# Patient Record
Sex: Male | Born: 1960
Health system: Southern US, Community
[De-identification: ages and names within clinical notes are randomized; demographics above are authoritative.]

## PROBLEM LIST (undated history)

## (undated) DIAGNOSIS — E119 Type 2 diabetes mellitus without complications: Secondary | ICD-10-CM

## (undated) DIAGNOSIS — J45909 Unspecified asthma, uncomplicated: Secondary | ICD-10-CM

## (undated) DIAGNOSIS — T7840XA Allergy, unspecified, initial encounter: Secondary | ICD-10-CM

## (undated) HISTORY — DX: Allergy, unspecified, initial encounter: T78.40XA

## (undated) HISTORY — DX: Unspecified asthma, uncomplicated: J45.909

## (undated) HISTORY — DX: Type 2 diabetes mellitus without complications: E11.9

---

## 2003-04-25 ENCOUNTER — Emergency Department (HOSPITAL_COMMUNITY): Admission: EM | Admit: 2003-04-25 | Discharge: 2003-04-25 | Payer: Self-pay | Admitting: Emergency Medicine

## 2007-06-11 ENCOUNTER — Emergency Department (HOSPITAL_COMMUNITY): Admission: EM | Admit: 2007-06-11 | Discharge: 2007-06-11 | Payer: Self-pay | Admitting: Emergency Medicine

## 2009-03-25 ENCOUNTER — Ambulatory Visit: Payer: Self-pay | Admitting: Internal Medicine

## 2009-03-28 LAB — CBC WITH DIFFERENTIAL/PLATELET
Basophils Absolute: 0 10*3/uL (ref 0.0–0.1)
Eosinophils Absolute: 0.1 10*3/uL (ref 0.0–0.5)
HGB: 8.8 g/dL — ABNORMAL LOW (ref 13.0–17.1)
MCV: 95.1 fL (ref 79.3–98.0)
MONO#: 0.3 10*3/uL (ref 0.1–0.9)
NEUT#: 1.4 10*3/uL — ABNORMAL LOW (ref 1.5–6.5)
RBC: 2.83 10*6/uL — ABNORMAL LOW (ref 4.20–5.82)
RDW: 22.8 % — ABNORMAL HIGH (ref 11.0–14.6)
WBC: 3.9 10*3/uL — ABNORMAL LOW (ref 4.0–10.3)
lymph#: 2 10*3/uL (ref 0.9–3.3)

## 2009-03-28 LAB — COMPREHENSIVE METABOLIC PANEL
ALT: 31 U/L (ref 0–53)
Albumin: 4.2 g/dL (ref 3.5–5.2)
CO2: 22 mEq/L (ref 19–32)
Chloride: 107 mEq/L (ref 96–112)
Glucose, Bld: 90 mg/dL (ref 70–99)
Potassium: 3.9 mEq/L (ref 3.5–5.3)
Sodium: 141 mEq/L (ref 135–145)
Total Protein: 6.7 g/dL (ref 6.0–8.3)

## 2009-03-28 LAB — RETICULOCYTES
RBC: 2.84 10*6/uL — ABNORMAL LOW (ref 4.20–5.82)
Retic %: 7.51 % — ABNORMAL HIGH (ref 0.50–1.60)

## 2009-03-28 LAB — LACTATE DEHYDROGENASE: LDH: 315 U/L — ABNORMAL HIGH (ref 94–250)

## 2009-03-31 LAB — DIRECT ANTIGLOBULIN TEST (NOT AT ARMC): DAT IgG: NEGATIVE

## 2009-03-31 LAB — PROTEIN ELECTROPHORESIS, SERUM
Albumin ELP: 61.1 % (ref 55.8–66.1)
Alpha-1-Globulin: 4.3 % (ref 2.9–4.9)
Alpha-2-Globulin: 9 % (ref 7.1–11.8)
Gamma Globulin: 14.8 % (ref 11.1–18.8)

## 2009-03-31 LAB — HEMOGLOBINOPATHY EVALUATION
Hemoglobin Other: 0 % (ref 0.0–0.0)
Hgb A: 97.5 % (ref 96.8–97.8)
Hgb S Quant: 0 % (ref 0.0–0.0)

## 2009-03-31 LAB — IRON AND TIBC
%SAT: 10 % — ABNORMAL LOW (ref 20–55)
Iron: 27 ug/dL — ABNORMAL LOW (ref 42–165)
UIBC: 234 ug/dL

## 2009-03-31 LAB — FOLATE: Folate: 11.4 ng/mL

## 2009-03-31 LAB — FERRITIN: Ferritin: 280 ng/mL (ref 22–322)

## 2009-03-31 LAB — VITAMIN B12: Vitamin B-12: 245 pg/mL (ref 211–911)

## 2009-04-18 LAB — CBC WITH DIFFERENTIAL/PLATELET
Basophils Absolute: 0.1 10*3/uL (ref 0.0–0.1)
Eosinophils Absolute: 0.2 10*3/uL (ref 0.0–0.5)
HGB: 12.6 g/dL — ABNORMAL LOW (ref 13.0–17.1)
MCV: 92.2 fL (ref 79.3–98.0)
MONO%: 7.7 % (ref 0.0–14.0)
NEUT#: 2.1 10*3/uL (ref 1.5–6.5)
Platelets: 385 10*3/uL (ref 140–400)
RDW: 19.3 % — ABNORMAL HIGH (ref 11.0–14.6)

## 2010-01-13 ENCOUNTER — Emergency Department (HOSPITAL_COMMUNITY): Admission: EM | Admit: 2010-01-13 | Discharge: 2010-01-13 | Payer: Self-pay | Admitting: Emergency Medicine

## 2010-09-01 ENCOUNTER — Emergency Department (HOSPITAL_COMMUNITY)
Admission: EM | Admit: 2010-09-01 | Discharge: 2010-09-01 | Disposition: A | Discharge status: Home / Self Care | Payer: Self-pay | Attending: Emergency Medicine | Admitting: Emergency Medicine

## 2010-09-01 DIAGNOSIS — R1032 Left lower quadrant pain: Secondary | ICD-10-CM | POA: Insufficient documentation

## 2010-09-01 DIAGNOSIS — R350 Frequency of micturition: Secondary | ICD-10-CM | POA: Insufficient documentation

## 2010-09-01 LAB — URINALYSIS, ROUTINE W REFLEX MICROSCOPIC
Glucose, UA: NEGATIVE mg/dL
Leukocytes, UA: NEGATIVE
pH: 6 (ref 5.0–8.0)

## 2012-08-11 ENCOUNTER — Emergency Department (HOSPITAL_COMMUNITY)
Admission: EM | Admit: 2012-08-11 | Discharge: 2012-08-11 | Disposition: A | Discharge status: Home / Self Care | Payer: Self-pay | Attending: Emergency Medicine | Admitting: Emergency Medicine

## 2012-08-11 ENCOUNTER — Emergency Department (HOSPITAL_COMMUNITY): Payer: Self-pay

## 2012-08-11 ENCOUNTER — Encounter (HOSPITAL_COMMUNITY): Payer: Self-pay | Admitting: Cardiology

## 2012-08-11 DIAGNOSIS — Y9389 Activity, other specified: Secondary | ICD-10-CM | POA: Insufficient documentation

## 2012-08-11 DIAGNOSIS — S4980XA Other specified injuries of shoulder and upper arm, unspecified arm, initial encounter: Secondary | ICD-10-CM | POA: Insufficient documentation

## 2012-08-11 DIAGNOSIS — R52 Pain, unspecified: Secondary | ICD-10-CM | POA: Insufficient documentation

## 2012-08-11 DIAGNOSIS — Y9289 Other specified places as the place of occurrence of the external cause: Secondary | ICD-10-CM | POA: Insufficient documentation

## 2012-08-11 DIAGNOSIS — M25512 Pain in left shoulder: Secondary | ICD-10-CM

## 2012-08-11 DIAGNOSIS — X500XXA Overexertion from strenuous movement or load, initial encounter: Secondary | ICD-10-CM | POA: Insufficient documentation

## 2012-08-11 DIAGNOSIS — S46909A Unspecified injury of unspecified muscle, fascia and tendon at shoulder and upper arm level, unspecified arm, initial encounter: Secondary | ICD-10-CM | POA: Insufficient documentation

## 2012-08-11 MED ORDER — TRAMADOL HCL 50 MG PO TABS: 50.0000 mg | ORAL_TABLET | Freq: Four times a day (QID) | ORAL | Status: DC | PRN
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
## 2012-08-14 NOTE — ED Provider Notes (Signed)
Medical screening examination/treatment/procedure(s) were performed by non-physician practitioner and as supervising physician I was immediately available for consultation/collaboration.   Lion Fernandez, MD 08/14/12 0712 

## 2013-03-24 ENCOUNTER — Emergency Department (HOSPITAL_COMMUNITY)
Admission: EM | Admit: 2013-03-24 | Discharge: 2013-03-24 | Disposition: A | Discharge status: Home / Self Care | Payer: PRIVATE HEALTH INSURANCE | Attending: Emergency Medicine | Admitting: Emergency Medicine

## 2013-03-24 ENCOUNTER — Encounter (HOSPITAL_COMMUNITY): Payer: Self-pay | Admitting: Emergency Medicine

## 2013-03-24 DIAGNOSIS — H579 Unspecified disorder of eye and adnexa: Secondary | ICD-10-CM | POA: Insufficient documentation

## 2013-03-24 DIAGNOSIS — J069 Acute upper respiratory infection, unspecified: Secondary | ICD-10-CM | POA: Insufficient documentation

## 2013-03-24 DIAGNOSIS — H9209 Otalgia, unspecified ear: Secondary | ICD-10-CM | POA: Insufficient documentation

## 2013-03-24 DIAGNOSIS — L299 Pruritus, unspecified: Secondary | ICD-10-CM | POA: Insufficient documentation

## 2013-03-24 DIAGNOSIS — Z79899 Other long term (current) drug therapy: Secondary | ICD-10-CM | POA: Insufficient documentation

## 2013-03-24 MED ORDER — DIPHENHYDRAMINE HCL 25 MG PO TABS: 25.0000 mg | ORAL_TABLET | Freq: Four times a day (QID) | ORAL | Status: DC | PRN

## 2013-03-24 MED ORDER — ACETAMINOPHEN 500 MG PO TABS: 500.0000 mg | ORAL_TABLET | Freq: Four times a day (QID) | ORAL | Status: DC | PRN

## 2013-03-24 NOTE — ED Provider Notes (Signed)
CSN: [id number]     Arrival date & time 03/24/13  1048 History   This chart was scribed for non-physician practitioner Emilia Beck, PA-C, working with Shon Baton, MD, by Yevette Edwards, ED Scribe. This patient was seen in room TR09C/TR09C and the patient's care was started at 12:32 PM. First MD Initiated Contact with Patient 03/24/13 1124     Chief Complaint  Patient presents with  . Nasal Congestion   The history is provided by the patient. No language interpreter was used.   HPI Comments: Ethan Holt is a 53 y.o. male who presents to the Emergency Department complaining of nasal congestion which has been occurring for five days, but which worsened yesterday evening.  As associated symptoms, the pt has also experienced a sore throat, eye itching, and otalgia. The pt has used OTC sinus/allergy medication with temporary relief. Ethan Holt is a non-smoker.   The pt is a Psychologist, occupational by trade.   History reviewed. No pertinent past medical history. History reviewed. No pertinent past surgical history. History reviewed. No pertinent family history. History  Substance Use Topics  . Smoking status: Never Smoker   . Smokeless tobacco: Not on file  . Alcohol Use: No    Review of Systems  Constitutional: Negative for fever.  HENT: Positive for congestion, ear pain, postnasal drip and sore throat.   Eyes: Positive for itching.  All other systems reviewed and are negative.   Allergies  Review of patient's allergies indicates no known allergies.  Home Medications   Current Outpatient Rx  Name  Route  Sig  Dispense  Refill  . Cyanocobalamin (VITAMIN B-12 PO)   Oral   Take 1 tablet by mouth daily.         . Multiple Vitamin (MULTIVITAMIN WITH MINERALS) TABS   Oral   Take 1 tablet by mouth daily.         Marland Kitchen OVER THE COUNTER MEDICATION   Oral   Take 1 tablet by mouth daily. Medication: Liver Right         . traMADol (ULTRAM) 50 MG tablet   Oral   Take 1 tablet (50 mg  total) by mouth every 6 (six) hours as needed for pain.   15 tablet   0    Triage Vitals: BP 132/92  Pulse 79  Temp(Src) 97.8 F (36.6 C) (Oral)  Resp 18  SpO2 94%  Physical Exam  Nursing note and vitals reviewed. Constitutional: He is oriented to person, place, and time. He appears well-developed and well-nourished. No distress.  HENT:  Head: Normocephalic and atraumatic.  Mild erythema to oropharynx. No exudate. No sinus tenderness to palpation.   Eyes: EOM are normal.  Neck: Neck supple. No tracheal deviation present.  Cardiovascular: Normal rate.   Pulmonary/Chest: Effort normal and breath sounds normal. No respiratory distress. He has no wheezes.  Musculoskeletal: Normal range of motion.  Neurological: He is alert and oriented to person, place, and time.  Skin: Skin is warm and dry.  Psychiatric: He has a normal mood and affect. His behavior is normal.    ED Course  Procedures (including critical care time)  DIAGNOSTIC STUDIES: Oxygen Saturation is 94% on room air, adequate by my interpretation.    COORDINATION OF CARE:  12:36 PM- Discussed treatment plan with patient, which includes medication for pain and congestion, and the patient agreed to the plan. Reminded the pt to use Tylenol as needed and to drink plenty of fluids.   Labs Review  Labs  Reviewed - No data to display Imaging Review No results found.  EKG Interpretation   None       MDM   1. URI (upper respiratory infection)     12:42 PM Patient likely has URI. Patient will have tylenol and benadryl for symptoms. Vitals stable and patient afebrile. Patient advised to return with worsening or concerning symptoms.   I personally performed the services described in this documentation, which was scribed in my presence. The recorded information has been reviewed and is accurate.    Emilia BeckKaitlyn Kaylana Fenstermacher, PA-C 03/24/13 1243

## 2013-03-24 NOTE — ED Provider Notes (Signed)
Medical screening examination/treatment/procedure(s) were performed by non-physician practitioner and as supervising physician I was immediately available for consultation/collaboration.  EKG Interpretation   None        Shon Batonourtney F Shenicka Sunderlin, MD 03/24/13 1347

## 2013-03-24 NOTE — ED Notes (Signed)
Pt c/o nasal congestion, drainage, sore throat, ear pain, and eyes aching x5 days

## 2013-03-24 NOTE — Discharge Instructions (Signed)
Take tylenol as needed for pain and fever. Take benadryl as needed for congestion. Refer to attached documents for more information.

## 2013-11-10 ENCOUNTER — Emergency Department (HOSPITAL_COMMUNITY)
Admission: EM | Admit: 2013-11-10 | Discharge: 2013-11-10 | Disposition: A | Discharge status: Home / Self Care | Payer: Managed Care, Other (non HMO) | Attending: Emergency Medicine | Admitting: Emergency Medicine

## 2013-11-10 ENCOUNTER — Encounter (HOSPITAL_COMMUNITY): Payer: Self-pay | Admitting: Emergency Medicine

## 2013-11-10 DIAGNOSIS — Z791 Long term (current) use of non-steroidal anti-inflammatories (NSAID): Secondary | ICD-10-CM | POA: Insufficient documentation

## 2013-11-10 DIAGNOSIS — H16139 Photokeratitis, unspecified eye: Secondary | ICD-10-CM | POA: Diagnosis not present

## 2013-11-10 DIAGNOSIS — Z79899 Other long term (current) drug therapy: Secondary | ICD-10-CM | POA: Insufficient documentation

## 2013-11-10 DIAGNOSIS — H5710 Ocular pain, unspecified eye: Secondary | ICD-10-CM | POA: Diagnosis present

## 2013-11-10 DIAGNOSIS — H16133 Photokeratitis, bilateral: Secondary | ICD-10-CM

## 2013-11-10 MED ORDER — FLUORESCEIN SODIUM 1 MG OP STRP
1.0000 | ORAL_STRIP | Freq: Once | OPHTHALMIC | Status: DC
  Filled 2013-11-10: qty 1

## 2013-11-10 MED ORDER — FLUORESCEIN-BENOXINATE 0.25-0.4 % OP SOLN
1.0000 [drp] | Freq: Once | OPHTHALMIC | Status: DC
  Filled 2013-11-10: qty 5

## 2013-11-10 MED ORDER — IBUPROFEN 800 MG PO TABS: 800.0000 mg | ORAL_TABLET | Freq: Three times a day (TID) | ORAL | Status: DC

## 2013-11-10 MED ORDER — TETRACAINE HCL 0.5 % OP SOLN
2.0000 [drp] | Freq: Once | OPHTHALMIC | Status: DC
  Filled 2013-11-10: qty 2
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
## 2014-04-21 ENCOUNTER — Encounter (HOSPITAL_COMMUNITY): Payer: Self-pay | Admitting: *Deleted

## 2014-04-21 ENCOUNTER — Emergency Department (HOSPITAL_COMMUNITY)
Admission: EM | Admit: 2014-04-21 | Discharge: 2014-04-22 | Disposition: A | Discharge status: Home / Self Care | Payer: Managed Care, Other (non HMO) | Attending: Emergency Medicine | Admitting: Emergency Medicine

## 2014-04-21 ENCOUNTER — Emergency Department (HOSPITAL_COMMUNITY): Payer: Managed Care, Other (non HMO)

## 2014-04-21 DIAGNOSIS — S199XXA Unspecified injury of neck, initial encounter: Secondary | ICD-10-CM | POA: Diagnosis present

## 2014-04-21 DIAGNOSIS — Y9389 Activity, other specified: Secondary | ICD-10-CM | POA: Diagnosis not present

## 2014-04-21 DIAGNOSIS — Z79899 Other long term (current) drug therapy: Secondary | ICD-10-CM | POA: Diagnosis not present

## 2014-04-21 DIAGNOSIS — Y92410 Unspecified street and highway as the place of occurrence of the external cause: Secondary | ICD-10-CM | POA: Insufficient documentation

## 2014-04-21 DIAGNOSIS — S161XXA Strain of muscle, fascia and tendon at neck level, initial encounter: Secondary | ICD-10-CM | POA: Diagnosis not present

## 2014-04-21 DIAGNOSIS — Z791 Long term (current) use of non-steroidal anti-inflammatories (NSAID): Secondary | ICD-10-CM | POA: Insufficient documentation

## 2014-04-21 DIAGNOSIS — Y998 Other external cause status: Secondary | ICD-10-CM | POA: Insufficient documentation

## 2014-04-21 DIAGNOSIS — S3992XA Unspecified injury of lower back, initial encounter: Secondary | ICD-10-CM | POA: Insufficient documentation
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
## 2014-04-22 MED ORDER — HYDROCODONE-ACETAMINOPHEN 5-325 MG PO TABS: 2.0000 | ORAL_TABLET | ORAL | Status: DC | PRN

## 2014-04-22 NOTE — Discharge Instructions (Signed)

## 2014-04-22 NOTE — ED Notes (Signed)
Pt made aware to return if symptoms worsen or if any life threatening symptoms occur.   

## 2015-07-04 ENCOUNTER — Emergency Department (HOSPITAL_COMMUNITY)
Admission: EM | Admit: 2015-07-04 | Discharge: 2015-07-04 | Disposition: A | Discharge status: Home / Self Care | Payer: Managed Care, Other (non HMO) | Attending: Emergency Medicine | Admitting: Emergency Medicine

## 2015-07-04 ENCOUNTER — Encounter (HOSPITAL_COMMUNITY): Payer: Self-pay | Admitting: *Deleted

## 2015-07-04 DIAGNOSIS — J069 Acute upper respiratory infection, unspecified: Secondary | ICD-10-CM | POA: Diagnosis not present

## 2015-07-04 DIAGNOSIS — Z791 Long term (current) use of non-steroidal anti-inflammatories (NSAID): Secondary | ICD-10-CM | POA: Insufficient documentation

## 2015-07-04 DIAGNOSIS — Z79899 Other long term (current) drug therapy: Secondary | ICD-10-CM | POA: Diagnosis not present

## 2015-07-04 DIAGNOSIS — J029 Acute pharyngitis, unspecified: Secondary | ICD-10-CM | POA: Diagnosis present

## 2015-07-04 MED ORDER — PROMETHAZINE-DM 6.25-15 MG/5ML PO SYRP: 5.0000 mL | ORAL_SOLUTION | Freq: Four times a day (QID) | ORAL | Status: DC | PRN

## 2015-07-04 MED ORDER — GUAIFENESIN 100 MG/5ML PO LIQD: 100.0000 mg | ORAL | Status: DC | PRN

## 2015-07-04 NOTE — Discharge Instructions (Signed)
Upper Respiratory Infection, Adult Most upper respiratory infections (URIs) are a viral infection of the air passages leading to the lungs. A URI affects the nose, throat, and upper air passages. The most common type of URI is nasopharyngitis and is typically referred to as "the common cold." URIs run their course and usually go away on their own. Most of the time, a URI does not require medical attention, but sometimes a bacterial infection in the upper airways can follow a viral infection. This is called a secondary infection. Sinus and middle ear infections are common types of secondary upper respiratory infections. Bacterial pneumonia can also complicate a URI. A URI can worsen asthma and chronic obstructive pulmonary disease (COPD). Sometimes, these complications can require emergency medical care and may be life threatening.  CAUSES Almost all URIs are caused by viruses. A virus is a type of germ and can spread from one person to another.  RISKS FACTORS You may be at risk for a URI if:   You smoke.   You have chronic heart or lung disease.  You have a weakened defense (immune) system.   You are very young or very old.   You have nasal allergies or asthma.  You work in crowded or poorly ventilated areas.  You work in health care facilities or schools. SIGNS AND SYMPTOMS  Symptoms typically develop 2-3 days after you come in contact with a cold virus. Most viral URIs last 7-10 days. However, viral URIs from the influenza virus (flu virus) can last 14-18 days and are typically more severe. Symptoms may include:   Runny or stuffy (congested) nose.   Sneezing.   Cough.   Sore throat.   Headache.   Fatigue.   Fever.   Loss of appetite.   Pain in your forehead, behind your eyes, and over your cheekbones (sinus pain).  Muscle aches.  DIAGNOSIS  Your health care provider may diagnose a URI by:  Physical exam.  Tests to check that your symptoms are not due to  another condition such as:  Strep throat.  Sinusitis.  Pneumonia.  Asthma. TREATMENT  A URI goes away on its own with time. It cannot be cured with medicines, but medicines may be prescribed or recommended to relieve symptoms. Medicines may help:  Reduce your fever.  Reduce your cough.  Relieve nasal congestion. HOME CARE INSTRUCTIONS   Take medicines only as directed by your health care provider.   Gargle warm saltwater or take cough drops to comfort your throat as directed by your health care provider.  Use a warm mist humidifier or inhale steam from a shower to increase air moisture. This may make it easier to breathe.  Drink enough fluid to keep your urine clear or pale yellow.   Eat soups and other clear broths and maintain good nutrition.   Rest as needed.   Return to work when your temperature has returned to normal or as your health care provider advises. You may need to stay home longer to avoid infecting others. You can also use a face mask and careful hand washing to prevent spread of the virus.  Increase the usage of your inhaler if you have asthma.   Do not use any tobacco products, including cigarettes, chewing tobacco, or electronic cigarettes. If you need help quitting, ask your health care provider. PREVENTION  The best way to protect yourself from getting a cold is to practice good hygiene.   Avoid oral or hand contact with people with cold   symptoms.   Wash your hands often if contact occurs.  There is no clear evidence that vitamin C, vitamin E, echinacea, or exercise reduces the chance of developing a cold. However, it is always recommended to get plenty of rest, exercise, and practice good nutrition.  SEEK MEDICAL CARE IF:   You are getting worse rather than better.   Your symptoms are not controlled by medicine.   You have chills.  You have worsening shortness of breath.  You have brown or red mucus.  You have yellow or brown nasal  discharge.  You have pain in your face, especially when you bend forward.  You have a fever.  You have swollen neck glands.  You have pain while swallowing.  You have white areas in the back of your throat. SEEK IMMEDIATE MEDICAL CARE IF:   You have severe or persistent:  Headache.  Ear pain.  Sinus pain.  Chest pain.  You have chronic lung disease and any of the following:  Wheezing.  Prolonged cough.  Coughing up blood.  A change in your usual mucus.  You have a stiff neck.  You have changes in your:  Vision.  Hearing.  Thinking.  Mood. MAKE SURE YOU:   Understand these instructions.  Will watch your condition.  Will get help right away if you are not doing well or get worse.   This information is not intended to replace advice given to you by your health care provider. Make sure you discuss any questions you have with your health care provider.   Document Released: 08/08/2000 Document Revised: 06/29/2014 Document Reviewed: 05/20/2013 Elsevier Interactive Patient Education 2016 [employer]  

## 2015-07-04 NOTE — ED Notes (Signed)
Declined W/C at D/C and was escorted to lobby by RN. 

## 2015-07-04 NOTE — ED Provider Notes (Signed)
CSN: [id number]     Arrival date & time 07/04/15  4098 History  By signing my name below, I, Sonum Patel, attest that this documentation has been prepared under the direction and in the presence of Fayrene Helper, PA-C. Electronically Signed: Sonum Patel, Neurosurgeon. 07/04/2015. 10:27 AM.    Chief Complaint  Patient presents with  . URI    The history is provided by the patient. No language interpreter was used.     HPI Comments: Ethan Holt is a 55 y.o. male who presents to the Emergency Department complaining of 4 days of constant sinus pressure with associated post-nasal drainage, bilateral eye pain, ear discomfort, and sneezing. He reports a sore throat 2 days ago which was alleviated with throat spray and gargling. He has been using OTC with mild relief. He reports sick contacts with similar symptoms. He denies cough, rash, SOB, CP, nausea, vomiting, diarrhea, fever, chills, generalized myalgias.   History reviewed. No pertinent past medical history. History reviewed. No pertinent past surgical history. History reviewed. No pertinent family history. Social History  Substance Use Topics  . Smoking status: Never Smoker   . Smokeless tobacco: None  . Alcohol Use: No    Review of Systems  Constitutional: Negative for fever and chills.  HENT: Positive for ear pain, postnasal drip, sinus pressure, sneezing and sore throat.   Eyes: Positive for pain.  Respiratory: Negative for cough and shortness of breath.   Cardiovascular: Negative for chest pain.  Gastrointestinal: Negative for nausea, vomiting, abdominal pain and diarrhea.  Skin: Negative for rash.      Allergies  Review of patient's allergies indicates no known allergies.  Home Medications   Prior to Admission medications   Medication Sig Start Date End Date Taking? Authorizing Provider  acetaminophen (TYLENOL) 500 MG tablet Take 1 tablet (500 mg total) by mouth every 6 (six) hours as needed. 03/24/13   Kaitlyn Szekalski, PA-C   Cyanocobalamin (VITAMIN B-12 PO) Take 2 tablets by mouth daily.     Historical Provider, MD  diphenhydrAMINE (BENADRYL) 25 MG tablet Take 1 tablet (25 mg total) by mouth every 6 (six) hours as needed for itching (Rash). 03/24/13   Emilia Beck, PA-C  HYDROcodone-acetaminophen (NORCO/VICODIN) 5-325 MG per tablet Take 2 tablets by mouth every 4 (four) hours as needed. 04/22/14   Elson Areas, PA-C  ibuprofen (ADVIL,MOTRIN) 800 MG tablet Take 1 tablet (800 mg total) by mouth 3 (three) times daily. 11/10/13   Tomasita Crumble, MD  Multiple Vitamin (MULTIVITAMIN WITH MINERALS) TABS Take 1 tablet by mouth daily.    Historical Provider, MD  naproxen sodium (ANAPROX) 220 MG tablet Take 440 mg by mouth 2 (two) times daily with a meal.    Historical Provider, MD  OVER THE COUNTER MEDICATION Take 1 tablet by mouth daily. Medication: Liver Right    Historical Provider, MD   BP 122/76 mmHg  Pulse 75  Temp(Src) 98.1 F (36.7 C) (Oral)  Resp 18  SpO2 99% Physical Exam  Constitutional: He is oriented to person, place, and time. He appears well-developed and well-nourished.  HENT:  Head: Normocephalic and atraumatic.  Nose: Right sinus exhibits maxillary sinus tenderness. Left sinus exhibits maxillary sinus tenderness.  Mouth/Throat: Uvula is midline, oropharynx is clear and moist and mucous membranes are normal. No uvula swelling. No oropharyngeal exudate.  Throat and uvula normal, no exudates, normal phonation. No lymphadenopathy. Mild maxillary or ethmoidal tenderness.   Neck: Normal range of motion.  Neck with full ROM and no nuchal  rigidity   Cardiovascular: Normal rate.   Pulmonary/Chest: Effort normal.  Lymphadenopathy:    He has no cervical adenopathy.  Neurological: He is alert and oriented to person, place, and time.  Skin: Skin is warm and dry.  Psychiatric: He has a normal mood and affect.  Nursing note and vitals reviewed.   ED Course  Procedures (including critical care  time)  DIAGNOSTIC STUDIES: Oxygen Saturation is 99% on RA, normal by my interpretation.    COORDINATION OF CARE: 10:29 AM Discussed symptomatic treatment with patient and will discharge home with guaifenesin and Promethazine-DM. Discussed treatment plan with pt at bedside and pt agreed to plan.     MDM   Final diagnoses:  URI (upper respiratory infection)    BP 122/76 mmHg  Pulse 75  Temp(Src) 98.1 F (36.7 C) (Oral)  Resp 18  SpO2 99%  I personally performed the services described in this documentation, which was scribed in my presence. The recorded information has been reviewed and is accurate.     Fayrene HelperBowie Tyreon Frigon, PA-C 07/04/15 1039  Alvira MondayErin Schlossman, MD 07/04/15 [phone]

## 2015-07-04 NOTE — ED Notes (Signed)
Pt reports sinus pressure and drainage since Friday. No acute distress noted at triage.

## 2015-08-19 ENCOUNTER — Emergency Department (HOSPITAL_BASED_OUTPATIENT_CLINIC_OR_DEPARTMENT_OTHER)
Admission: EM | Admit: 2015-08-19 | Discharge: 2015-08-19 | Disposition: A | Discharge status: Home / Self Care | Payer: Managed Care, Other (non HMO) | Attending: Emergency Medicine | Admitting: Emergency Medicine

## 2015-08-19 ENCOUNTER — Encounter (HOSPITAL_BASED_OUTPATIENT_CLINIC_OR_DEPARTMENT_OTHER): Payer: Self-pay | Admitting: Emergency Medicine

## 2015-08-19 DIAGNOSIS — M25511 Pain in right shoulder: Secondary | ICD-10-CM | POA: Insufficient documentation

## 2015-08-19 NOTE — ED Notes (Signed)
Patient states that he woke up yesterday morning with right shoulder pain. Patient states that he feels like he slept wrong on his shoulder. The patient reports that he woke up this am with the same pain. The patient reports that it hurts more with movement

## 2015-08-19 NOTE — ED Provider Notes (Signed)
CSN: [id number]     Arrival date & time 08/19/15  [id number] History   First MD Initiated Contact with Patient 08/19/15 1002     Chief Complaint  Patient presents with  . Shoulder Pain    HPI   55 year old male presents today with right shoulder pain. Patient reports that he woke up 2 days ago with pain in the anterior shoulder. Patient reports symptoms slightly improved, woke up again this morning with right anterior shoulder pain. Patient reports the pain is sharp in nature, worse with overhead movements. He denies any loss of distal sensation strength or motor function. He denies any redness, warmth to touch, history of trauma. No history of the same. No medications prior to arrival  History reviewed. No pertinent past medical history. History reviewed. No pertinent past surgical history. History reviewed. No pertinent family history. Social History  Substance Use Topics  . Smoking status: Never Smoker   . Smokeless tobacco: None  . Alcohol Use: No    Review of Systems  All other systems reviewed and are negative.   Allergies  Review of patient's allergies indicates no known allergies.  Home Medications   Prior to Admission medications   Medication Sig Start Date End Date Taking? Authorizing Provider  acetaminophen (TYLENOL) 500 MG tablet Take 1 tablet (500 mg total) by mouth every 6 (six) hours as needed. 03/24/13   Kaitlyn Szekalski, PA-C  Cyanocobalamin (VITAMIN B-12 PO) Take 2 tablets by mouth daily.     Historical Provider, MD  diphenhydrAMINE (BENADRYL) 25 MG tablet Take 1 tablet (25 mg total) by mouth every 6 (six) hours as needed for itching (Rash). 03/24/13   Kaitlyn Szekalski, PA-C  guaiFENesin (ROBITUSSIN) 100 MG/5ML liquid Take 5-10 mLs (100-200 mg total) by mouth every 4 (four) hours as needed for congestion. 07/04/15   Fayrene HelperBowie Tran, PA-C  HYDROcodone-acetaminophen (NORCO/VICODIN) 5-325 MG per tablet Take 2 tablets by mouth every 4 (four) hours as needed. 04/22/14   Elson AreasLeslie K  Sofia, PA-C  ibuprofen (ADVIL,MOTRIN) 800 MG tablet Take 1 tablet (800 mg total) by mouth 3 (three) times daily. 11/10/13   Tomasita CrumbleAdeleke Oni, MD  Multiple Vitamin (MULTIVITAMIN WITH MINERALS) TABS Take 1 tablet by mouth daily.    Historical Provider, MD  naproxen sodium (ANAPROX) 220 MG tablet Take 440 mg by mouth 2 (two) times daily with a meal.    Historical Provider, MD  OVER THE COUNTER MEDICATION Take 1 tablet by mouth daily. Medication: Liver Right    Historical Provider, MD  promethazine-dextromethorphan (PROMETHAZINE-DM) 6.25-15 MG/5ML syrup Take 5 mLs by mouth 4 (four) times daily as needed for cough. 07/04/15   Fayrene HelperBowie Tran, PA-C   BP 128/92 mmHg  Pulse 65  Temp(Src) 97.6 F (36.4 C) (Oral)  Resp 18  Wt 94.348 kg  SpO2 100% Physical Exam  Constitutional: He is oriented to person, place, and time. He appears well-developed and well-nourished.  HENT:  Head: Normocephalic and atraumatic.  Eyes: Conjunctivae are normal. Pupils are equal, round, and reactive to light. Right eye exhibits no discharge. Left eye exhibits no discharge. No scleral icterus.  Neck: Normal range of motion. No JVD present. No tracheal deviation present.  Pulmonary/Chest: Effort normal. No stridor.  Musculoskeletal:  Minor tenderness to palpation of the right anterior shoulder, full active range of motion. Pain with forward flexion, external rotation. Grip strength 5 out of 5, sensation intact, cap refill less than 3 seconds. No warmth to touch, swelling, signs of infectious etiology, no signs of trauma  Neurological: He is alert and oriented to person, place, and time. Coordination normal.  Psychiatric: He has a normal mood and affect. His behavior is normal. Judgment and thought content normal.  Nursing note and vitals reviewed.   ED Course  Procedures (including critical care time) Labs Review Labs Reviewed - No data to display  Imaging Review No results found. I have personally reviewed and evaluated these  images and lab results as part of my medical decision-making.   EKG Interpretation None      MDM   Final diagnoses:  Right shoulder pain    Labs:  Imaging:  Consults:  Therapeutics:  Discharge Meds:   Assessment/Plan:54 from male presents today with right shoulder pain. This is likely musculoskeletal in nature. Patient has no signs of infectious etiology, no concerning signs or symptoms that would necessitate acute imaging here. Patient will be referred to orthopedist for further evaluation if symptoms persist beyond 2 weeks, or if they worsen. Patient instructed to use ice, ibuprofen, no heavy lifting, shoulder exercises. Return precautions given, verbalized understanding and agreement today's plan        Eyvonne MechanicJeffrey Wyman Meschke, PA-C 08/19/15 1021  Rolan BuccoMelanie Belfi, MD 08/19/15 1040

## 2015-08-19 NOTE — Discharge Instructions (Signed)
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
## 2017-07-06 DIAGNOSIS — J0100 Acute maxillary sinusitis, unspecified: Secondary | ICD-10-CM | POA: Diagnosis not present

## 2018-01-27 DIAGNOSIS — J302 Other seasonal allergic rhinitis: Secondary | ICD-10-CM | POA: Diagnosis not present

## 2018-01-27 DIAGNOSIS — T7849XA Other allergy, initial encounter: Secondary | ICD-10-CM | POA: Diagnosis not present

## 2018-02-23 ENCOUNTER — Encounter (HOSPITAL_BASED_OUTPATIENT_CLINIC_OR_DEPARTMENT_OTHER): Payer: Self-pay | Admitting: Emergency Medicine

## 2018-02-23 ENCOUNTER — Other Ambulatory Visit: Payer: Self-pay

## 2018-02-23 ENCOUNTER — Emergency Department (HOSPITAL_BASED_OUTPATIENT_CLINIC_OR_DEPARTMENT_OTHER)
Admission: EM | Admit: 2018-02-23 | Discharge: 2018-02-23 | Disposition: A | Discharge status: Home / Self Care | Payer: BLUE CROSS/BLUE SHIELD | Attending: Emergency Medicine | Admitting: Emergency Medicine

## 2018-02-23 DIAGNOSIS — E119 Type 2 diabetes mellitus without complications: Secondary | ICD-10-CM

## 2018-02-23 DIAGNOSIS — R739 Hyperglycemia, unspecified: Secondary | ICD-10-CM

## 2018-02-23 DIAGNOSIS — Z79899 Other long term (current) drug therapy: Secondary | ICD-10-CM | POA: Diagnosis not present

## 2018-02-23 DIAGNOSIS — Z7984 Long term (current) use of oral hypoglycemic drugs: Secondary | ICD-10-CM | POA: Insufficient documentation

## 2018-02-23 DIAGNOSIS — N179 Acute kidney failure, unspecified: Secondary | ICD-10-CM | POA: Diagnosis not present

## 2018-02-23 DIAGNOSIS — R5383 Other fatigue: Secondary | ICD-10-CM | POA: Diagnosis not present

## 2018-02-23 DIAGNOSIS — E1165 Type 2 diabetes mellitus with hyperglycemia: Secondary | ICD-10-CM | POA: Insufficient documentation

## 2018-02-23 LAB — BASIC METABOLIC PANEL
Anion gap: 14 (ref 5–15)
BUN: 20 mg/dL (ref 6–20)
CHLORIDE: 94 mmol/L — AB (ref 98–111)
CO2: 22 mmol/L (ref 22–32)
Calcium: 10.5 mg/dL — ABNORMAL HIGH (ref 8.9–10.3)
Creatinine, Ser: 1.39 mg/dL — ABNORMAL HIGH (ref 0.61–1.24)
GFR calc non Af Amer: 55 mL/min — ABNORMAL LOW (ref >60)
GLUCOSE: 625 mg/dL — AB (ref 70–99)
POTASSIUM: 4.5 mmol/L (ref 3.5–5.1)
Sodium: 130 mmol/L — ABNORMAL LOW (ref 135–145)

## 2018-02-23 LAB — URINALYSIS, ROUTINE W REFLEX MICROSCOPIC
Bilirubin Urine: NEGATIVE
Hgb urine dipstick: NEGATIVE
Ketones, ur: 40 mg/dL — AB
Leukocytes, UA: NEGATIVE
Nitrite: NEGATIVE
PROTEIN: NEGATIVE mg/dL
pH: 5.5 (ref 5.0–8.0)

## 2018-02-23 LAB — CBC
HCT: 46.1 % (ref 39.0–52.0)
HEMOGLOBIN: 14.6 g/dL (ref 13.0–17.0)
MCH: 23.2 pg — ABNORMAL LOW (ref 26.0–34.0)
MCHC: 31.7 g/dL (ref 30.0–36.0)
MCV: 73.3 fL — AB (ref 80.0–100.0)
NRBC: 0 % (ref 0.0–0.2)
PLATELETS: 361 10*3/uL (ref 150–400)
RBC: 6.29 MIL/uL — AB (ref 4.22–5.81)
RDW: 13.8 % (ref 11.5–15.5)
WBC: 7.1 10*3/uL (ref 4.0–10.5)

## 2018-02-23 LAB — CBG MONITORING, ED
GLUCOSE-CAPILLARY: 510 mg/dL — AB (ref 70–99)
Glucose-Capillary: >600 mg/dL (ref 70–99)
Glucose-Capillary: 448 mg/dL — ABNORMAL HIGH (ref 70–99)

## 2018-02-23 LAB — URINALYSIS, MICROSCOPIC (REFLEX)
RBC / HPF: NONE SEEN RBC/hpf (ref 0–5)
SQUAMOUS EPITHELIAL / LPF: NONE SEEN (ref 0–5)

## 2018-02-23 MED ORDER — SODIUM CHLORIDE 0.9 % IV BOLUS
2000.0000 mL | Freq: Once | INTRAVENOUS | Status: AC
  Administered 2018-02-23: 2000 mL via INTRAVENOUS

## 2018-02-23 MED ORDER — METFORMIN HCL 1000 MG PO TABS: 1000.0000 mg | ORAL_TABLET | Freq: Two times a day (BID) | ORAL | 0 refills | Status: DC

## 2018-02-23 MED ORDER — METFORMIN HCL 500 MG PO TABS
500.0000 mg | ORAL_TABLET | Freq: Once | ORAL | Status: AC
  Administered 2018-02-23: 500 mg via ORAL
  Filled 2018-02-23: qty 1

## 2018-02-23 NOTE — Discharge Instructions (Addendum)
All the 3 clinics listed below tomorrow to set up an appointment.  Tell them that you were recently found to be diabetic, and need to establish care and management. Take metformin twice a day. Nature staying well-hydrated water. Avoid sugars and carbohydrates.  There is information about foods and eating plans for diabetes. Return to the emergency room if you develop any new, worsening, concerning symptoms.  Return to the ER or to an urgent care in 1 week for blood sugar recheck if you are unable to establish care with a primary care doctor.

## 2018-02-23 NOTE — ED Notes (Signed)
Pt reports taking supplements every day. No other medication. No other hx. Pt reports frequent thirst and fatigue.

## 2018-02-23 NOTE — ED Notes (Addendum)
Blood sugar is 625. Sophia PA is aware

## 2018-02-23 NOTE — ED Triage Notes (Signed)
Pt states he has increase fatigue, thirsty and urination for the past 2 weeks denies any diabetes hx.

## 2018-02-23 NOTE — ED Provider Notes (Signed)
[facility] EMERGENCY DEPARTMENT Provider Note   CSN: [id number] Arrival date & time: 02/23/18  1856     History   Chief Complaint Chief Complaint  Patient presents with  . Fatigue    HPI Ethan Holt is a 57 y.o. male presenting for evaluation of polyuria, polydipsia, and tiredness.  Patient states about 3 weeks ago, he started to develop polydipsia.  2 weeks ago, he developed polyuria, and for the past several days, he has had much more tiredness and fatigue.  He states he feels sluggish.  He denies history of similar.  He denies fevers, chills, nasal congestion, sore throat, cough, chest pain, shortness breath, nausea, vomiting, abdominal pain, or normal bowel movements.  He denies dysuria or hematuria.  Patient states he has no medical problems, takes no medications daily.  He denies a history of diabetes.  Pt states he does not have a PCP, but is working on it  HPI  History reviewed. No pertinent past medical history.  There are no active problems to display for this patient.   History reviewed. No pertinent surgical history.      Home Medications    Prior to Admission medications   Medication Sig Start Date End Date Taking? Authorizing Provider  acetaminophen (TYLENOL) 500 MG tablet Take 1 tablet (500 mg total) by mouth every 6 (six) hours as needed. 03/24/13   Szekalski, Yvonna Alanis, PA-C  Cyanocobalamin (VITAMIN B-12 PO) Take 2 tablets by mouth daily.     [provider]  diphenhydrAMINE (BENADRYL) 25 MG tablet Take 1 tablet (25 mg total) by mouth every 6 (six) hours as needed for itching (Rash). 03/24/13   Emilia Beck, PA-C  guaiFENesin (ROBITUSSIN) 100 MG/5ML liquid Take 5-10 mLs (100-200 mg total) by mouth every 4 (four) hours as needed for congestion. 07/04/15   Fayrene Helper, PA-C  HYDROcodone-acetaminophen (NORCO/VICODIN) 5-325 MG per tablet Take 2 tablets by mouth every 4 (four) hours as needed. 04/22/14   Elson Areas, PA-C  ibuprofen  (ADVIL,MOTRIN) 800 MG tablet Take 1 tablet (800 mg total) by mouth 3 (three) times daily. 11/10/13   Tomasita Crumble, MD  metFORMIN (GLUCOPHAGE) 1000 MG tablet Take 1 tablet (1,000 mg total) by mouth 2 (two) times daily for 14 days. 02/23/18 03/09/18  Euva Rundell, PA-C  Multiple Vitamin (MULTIVITAMIN WITH MINERALS) TABS Take 1 tablet by mouth daily.    [provider]  naproxen sodium (ANAPROX) 220 MG tablet Take 440 mg by mouth 2 (two) times daily with a meal.    [provider]  OVER THE COUNTER MEDICATION Take 1 tablet by mouth daily. Medication: Liver Right    [provider]  promethazine-dextromethorphan (PROMETHAZINE-DM) 6.25-15 MG/5ML syrup Take 5 mLs by mouth 4 (four) times daily as needed for cough. 07/04/15   Fayrene Helper, PA-C    Family History No family history on file.  Social History Social History   Tobacco Use  . Smoking status: Never Smoker  Substance Use Topics  . Alcohol use: No  . Drug use: No     Allergies   Patient has no known allergies.   Review of Systems Review of Systems  Constitutional: Positive for fatigue.  Endocrine: Positive for polydipsia and polyuria.  All other systems reviewed and are negative.    Physical Exam Updated Vital Signs BP (!) 148/92 (BP Location: Right Arm)   Pulse 73   Temp 97.8 F (36.6 C) (Oral)   Resp 18   Ht 5\' 9"  (1.753 m)  Wt 84.4 kg   SpO2 100%   BMI 27.47 kg/m   Physical Exam Vitals signs and nursing note reviewed.  Constitutional:      General: He is not in acute distress.    Appearance: He is well-developed.     Comments: Sitting in the bed in no acute distress  HENT:     Head: Normocephalic and atraumatic.     Mouth/Throat:     Mouth: Mucous membranes are dry.     Comments: MM dry.  OP clear without tonsillar swelling or exudate.  Uvula midline with ago palate rise.  TMs nonerythematous nonbulging bilaterally. Eyes:     Conjunctiva/sclera: Conjunctivae normal.     Pupils:  Pupils are equal, round, and reactive to light.  Neck:     Musculoskeletal: Normal range of motion and neck supple.  Cardiovascular:     Rate and Rhythm: Normal rate and regular rhythm.     Comments: Heart rate normal upon my evaluation Pulmonary:     Effort: Pulmonary effort is normal. No respiratory distress.     Breath sounds: Normal breath sounds. No wheezing.  Abdominal:     General: There is no distension.     Palpations: Abdomen is soft. There is no mass.     Tenderness: There is no abdominal tenderness. There is no guarding or rebound.  Musculoskeletal: Normal range of motion.  Skin:    General: Skin is warm and dry.     Capillary Refill: Capillary refill takes less than 2 seconds.  Neurological:     Mental Status: He is alert and oriented to person, place, and time.     Comments: Alert and oriented.      ED Treatments / Results  Labs (all labs ordered are listed, but only abnormal results are displayed) Labs Reviewed  BASIC METABOLIC PANEL - Abnormal; Notable for the following components:      Result Value   Sodium 130 (*)    Chloride 94 (*)    Glucose, Bld 625 (*)    Creatinine, Ser 1.39 (*)    Calcium 10.5 (*)    GFR calc non Af Amer 55 (*)    All other components within normal limits  CBC - Abnormal; Notable for the following components:   RBC 6.29 (*)    MCV 73.3 (*)    MCH 23.2 (*)    All other components within normal limits  URINALYSIS, ROUTINE W REFLEX MICROSCOPIC - Abnormal; Notable for the following components:   Color, Urine STRAW (*)    Specific Gravity, Urine <1.005 (*)    Glucose, UA >=500 (*)    Ketones, ur 40 (*)    All other components within normal limits  URINALYSIS, MICROSCOPIC (REFLEX) - Abnormal; Notable for the following components:   Bacteria, UA RARE (*)    All other components within normal limits  CBG MONITORING, ED - Abnormal; Notable for the following components:   Glucose-Capillary >600 (*)    All other components within  normal limits  CBG MONITORING, ED - Abnormal; Notable for the following components:   Glucose-Capillary 510 (*)    All other components within normal limits  CBG MONITORING, ED - Abnormal; Notable for the following components:   Glucose-Capillary 448 (*)    All other components within normal limits    EKG None  Radiology No results found.  Procedures Procedures (including critical care time)  Medications Ordered in ED Medications  sodium chloride 0.9 % bolus 2,000 mL (0 mLs Intravenous  Stopped 02/23/18 2220)  metFORMIN (GLUCOPHAGE) tablet 500 mg (500 mg Oral Given 02/23/18 2251)     Initial Impression / Assessment and Plan / ED Course  I have reviewed the triage vital signs and the nursing notes.  Pertinent labs & imaging results that were available during my care of the patient were reviewed by me and considered in my medical decision making (see chart for details).     Patient presenting for evaluation of polyuria, polydipsia, and fatigue.  Physical exam shows patient who appears dehydrated, but in no acute distress.  Blood sugar critically elevated at 625.  Patient without a history of diabetes.  Discussed findings with patient.  Will obtain blood work to rule out DKA or concerning kidney injury.  2 L fluid bolus given, patient encouraged to drink water.  Labs show mild AKI at 1.39, GFR stable.  Otherwise, reassuring, no sign of DKA.  Urine without infection, does show glucose.  On recheck, BGL gradually improving, went down 200.  Reassessment of the patient, he states he is feeling much better.  He has no further sluggishness, and does not feel as thirsty.  I discussed findings with patient, informed him that this is indicative of diabetes.  Discussed with him that his kidney function was slightly worse today, and he will need to be followed up closely with primary care for further management.  Patient offered admission due to new diabetes and no established primary care, but as  he is feeling better he would like to go home.  Patient instructed to return immediately if anything worsens.  Patient to return in 1 week for recheck if he is unable to establish with a primary care.  Will consult with case management to assist in making an appointment.  Discussed at length importance of diet.  Will start patient on metformin twice daily.  At this time, patient appears safe for discharge.  Return precautions given.  Patient states he understands and agrees plan.   Final Clinical Impressions(s) / ED Diagnoses   Final diagnoses:  Type 2 diabetes mellitus without complication, without long-term current use of insulin (HCC)  Hyperglycemia  AKI (acute kidney injury) [facility](HCC)    ED Discharge Orders         Ordered    metFORMIN (GLUCOPHAGE) 1000 MG tablet  2 times daily     02/23/18 2239           Alveria ApleyCaccavale, Kollins Fenter, PA-C 02/23/18 2331    Melene PlanFloyd, Dan, DO 02/24/18 0016

## 2018-02-23 NOTE — ED Notes (Signed)
ED Provider at bedside. 

## 2018-02-24 NOTE — Care Management Note (Signed)
Case Management Note  CM consulted for no pcp with need for follow up.  CM contacted pt via phone and advised him to call [employer] to get a list of in-network doctors.  He is aware he may not get an appointment very quickly to establish care as a new pt.  [facility] possible [facility] care appointment due to the fact that pt was only given 14 days work of metformin as a new DM.  CM is not even sure that pt will get an appointment with TOC before he runs out of medication.  Updated [place], PA via messages.  No further CM needs noted at this time.  Santia Labate, Lynnae SandhoffAngela N, RN 02/24/2018, 9:20 AM

## 2018-02-25 ENCOUNTER — Telehealth: Payer: Self-pay

## 2018-02-25 NOTE — Telephone Encounter (Signed)
Message received from Vernice JeffersonAnglea Kritzer, RN CM requesting a hospital [facility].  Informed her that the patient has an appointment scheduled to establish care at [facility] 02/27/2018.

## 2018-02-27 ENCOUNTER — Telehealth: Payer: Self-pay | Admitting: Medical

## 2018-02-27 ENCOUNTER — Encounter: Payer: Self-pay | Admitting: Medical

## 2018-02-27 ENCOUNTER — Ambulatory Visit (INDEPENDENT_AMBULATORY_CARE_PROVIDER_SITE_OTHER): Payer: BLUE CROSS/BLUE SHIELD | Admitting: Medical

## 2018-02-27 VITALS — BP 124/73 | HR 86 | Temp 97.7°F | Resp 16 | Ht 69.0 in | Wt 191.9 lb

## 2018-02-27 DIAGNOSIS — Z0100 Encounter for examination of eyes and vision without abnormal findings: Secondary | ICD-10-CM

## 2018-02-27 DIAGNOSIS — R5383 Other fatigue: Secondary | ICD-10-CM | POA: Diagnosis not present

## 2018-02-27 DIAGNOSIS — E119 Type 2 diabetes mellitus without complications: Secondary | ICD-10-CM

## 2018-02-27 DIAGNOSIS — R0683 Snoring: Secondary | ICD-10-CM

## 2018-02-27 LAB — CBC WITH DIFFERENTIAL/PLATELET
Basophils Absolute: 0 10*3/uL (ref 0.0–0.1)
Basophils Relative: 0.8 % (ref 0.0–3.0)
Eosinophils Absolute: 0.1 10*3/uL (ref 0.0–0.7)
Eosinophils Relative: 1.6 % (ref 0.0–5.0)
HCT: 44 % (ref 39.0–52.0)
Hemoglobin: 14 g/dL (ref 13.0–17.0)
LYMPHS ABS: 1.9 10*3/uL (ref 0.7–4.0)
Lymphocytes Relative: 40.1 % (ref 12.0–46.0)
MCHC: 31.8 g/dL (ref 30.0–36.0)
MCV: 74.1 fl — ABNORMAL LOW (ref 78.0–100.0)
MONO ABS: 0.4 10*3/uL (ref 0.1–1.0)
Monocytes Relative: 9 % (ref 3.0–12.0)
Neutro Abs: 2.3 10*3/uL (ref 1.4–7.7)
Neutrophils Relative %: 48.5 % (ref 43.0–77.0)
Platelets: 298 10*3/uL (ref 150.0–400.0)
RBC: 5.95 Mil/uL — ABNORMAL HIGH (ref 4.22–5.81)
RDW: 14.3 % (ref 11.5–15.5)
WBC: 4.8 10*3/uL (ref 4.0–10.5)

## 2018-02-27 LAB — COMPREHENSIVE METABOLIC PANEL
ALT: 25 U/L (ref 0–53)
AST: 18 U/L (ref 0–37)
Albumin: 4.1 g/dL (ref 3.5–5.2)
Alkaline Phosphatase: 68 U/L (ref 39–117)
BUN: 24 mg/dL — AB (ref 6–23)
CHLORIDE: 100 meq/L (ref 96–112)
CO2: 18 meq/L — AB (ref 19–32)
CREATININE: 1.19 mg/dL (ref 0.40–1.50)
Calcium: 9.7 mg/dL (ref 8.4–10.5)
GFR: 80.46 mL/min (ref >60.00)
Glucose, Bld: 148 mg/dL — ABNORMAL HIGH (ref 70–99)
Potassium: 5.3 mEq/L — ABNORMAL HIGH (ref 3.5–5.1)
SODIUM: 133 meq/L — AB (ref 135–145)
Total Bilirubin: 0.8 mg/dL (ref 0.2–1.2)
Total Protein: 6.7 g/dL (ref 6.0–8.3)

## 2018-02-27 LAB — HEMOGLOBIN A1C: Hgb A1c MFr Bld: 14.9 % — ABNORMAL HIGH (ref 4.6–6.5)

## 2018-02-27 LAB — VITAMIN B12

## 2018-02-27 LAB — LIPID PANEL
CHOL/HDL RATIO: 5
Cholesterol: 208 mg/dL — ABNORMAL HIGH (ref 0–200)
HDL: 38.9 mg/dL — ABNORMAL LOW (ref >39.00)
LDL CALC: 146 mg/dL — AB (ref 0–99)
NonHDL: 169.37
Triglycerides: 116 mg/dL (ref 0.0–149.0)
VLDL: 23.2 mg/dL (ref 0.0–40.0)

## 2018-02-27 LAB — VITAMIN D 25 HYDROXY (VIT D DEFICIENCY, FRACTURES): VITD: 20.74 ng/mL — ABNORMAL LOW (ref 30.00–100.00)

## 2018-02-27 LAB — TSH: TSH: 1.57 u[IU]/mL (ref 0.35–4.50)

## 2018-02-27 MED ORDER — METFORMIN HCL 1000 MG PO TABS: 1000.0000 mg | ORAL_TABLET | Freq: Two times a day (BID) | ORAL | 0 refills | Status: DC

## 2018-02-27 MED ORDER — VITAMIN D (ERGOCALCIFEROL) 1.25 MG (50000 UNIT) PO CAPS: 50000.0000 [IU] | ORAL_CAPSULE | ORAL | 0 refills | Status: DC

## 2018-02-27 MED ORDER — EMPAGLIFLOZIN 10 MG PO TABS: 10.0000 mg | ORAL_TABLET | Freq: Every day | ORAL | 3 refills | Status: DC

## 2018-02-27 MED ORDER — METFORMIN HCL 1000 MG PO TABS: 1000.0000 mg | ORAL_TABLET | Freq: Two times a day (BID) | ORAL | 5 refills | Status: DC

## 2018-02-27 MED ORDER — ATORVASTATIN CALCIUM 10 MG PO TABS: 10.0000 mg | ORAL_TABLET | Freq: Every day | ORAL | 3 refills | Status: DC

## 2018-02-27 NOTE — Patient Instructions (Signed)
Nice to meet you today.  For your new diagnosis of diabetes, I want you to continue with metformin and I prescribed Jardiance as add-on.  I want you to call your insurance and see if they will send you a glucometer or get the preferred name of the glucometer that they cover.  Give me the name of the glucometer and I could send in prescription of a diabetic supplies.  I did go ahead and place referral for diabetic education.  Start eating a low sugar diet and getting some daily exercise.  We will get metabolic panel today and A1c.  Your reported fatigue might be associated with diabetes.  But will go ahead and add CBC, B12, B1, TSH and vitamin D to your lab work today.  Also added lipid panel to blood work to assess you have high cholesterol.  Even if your cholesterol is not very high it would likely be best for you to start a statin low dose.  For snoring, I placed referral to pulmonologist.  Also did place referral to optometrist for diabetic eye exam.  Referral staff should be calling you to get the name of the optometrist that you have used in the past.  Follow-up in 3 to 4 weeks or as needed.

## 2018-02-27 NOTE — Telephone Encounter (Signed)
Rx atorvastatin and vit D to pharmacy.

## 2018-02-27 NOTE — Progress Notes (Signed)
Subjective:    Patient ID: Ethan Holt, male    DOB: 1960/04/06, 58 y.o.   MRN: [id number]  HPI  First time in with me. He works for [employer]. He drives a box truck. Pt states he does work out  once a week now. Pt was drinking soda but stopped since dx of diabetes.   Pt in for follow up from ED.(those notes were reviewed today)   Pt states just diagnosed this Sunday. Pt was not getting physicals over the years.  Pt was started on metformin 1000 mg 1 tab po bid.(no side effects)  He was having polyuria, polydipsia and severe fatigue before ED evaluation.  He feels some better than he did in ED. Feels some fatigued daily now.   Wife is a Engineer, civil (consulting)nurse. Pt sugars stared to come down since Tuesday. Was 300 and now in high 190. Pt wife is a Engineer, civil (consulting)nurse. She had jardiance and gave him tab. She states this seemed to help a lot.     Review of Systems  Constitutional: Positive for fatigue. Negative for chills and fever.  HENT: Negative for congestion and drooling.        Pt wife states he snores and it apperas he falls asleep easily.  Respiratory: Negative for cough, chest tightness, shortness of breath and wheezing.   Cardiovascular: Negative for chest pain and palpitations.  Gastrointestinal: Negative for abdominal pain.  Endocrine: Negative for polydipsia, polyphagia and polyuria.  Genitourinary: Negative for dysuria, flank pain, frequency, penile pain and testicular pain.  Musculoskeletal: Negative for back pain, neck pain and neck stiffness.  Skin: Negative for rash.  Neurological: Negative for dizziness, speech difficulty, weakness and numbness.  Hematological: Negative for adenopathy. Does not bruise/bleed easily.  Psychiatric/Behavioral: Negative for behavioral problems, confusion, sleep disturbance and suicidal ideas.    Past Medical History:  Diagnosis Date  . Diabetes mellitus without complication [facility](HCC)      Social History   Socioeconomic History  . Marital status: Married   Spouse name: Not on file  . Number of children: Not on file  . Years of education: Not on file  . Highest education level: Not on file  Occupational History  . Not on file  Social Needs  . Financial resource strain: Not on file  . Food insecurity:    Worry: Not on file    Inability: Not on file  . Transportation needs:    Medical: Not on file    Non-medical: Not on file  Tobacco Use  . Smoking status: Never Smoker  . Smokeless tobacco: Current User  Substance and Sexual Activity  . Alcohol use: No  . Drug use: No  . Sexual activity: Not on file  Lifestyle  . Physical activity:    Days per week: Not on file    Minutes per session: Not on file  . Stress: Not on file  Relationships  . Social connections:    Talks on phone: Not on file    Gets together: Not on file    Attends religious service: Not on file    Active member of club or organization: Not on file    Attends meetings of clubs or organizations: Not on file    Relationship status: Not on file  . Intimate partner violence:    Fear of current or ex partner: Not on file    Emotionally abused: Not on file    Physically abused: Not on file    Forced sexual activity: Not on file  Other Topics Concern  . Not on file  Social History Narrative  . Not on file    No past surgical history on file.  No family history on file.  No Known Allergies  Current Outpatient Medications on File Prior to Visit  Medication Sig Dispense Refill  . Cyanocobalamin (VITAMIN B-12 PO) Take 2 tablets by mouth daily.     Marland Kitchen ibuprofen (ADVIL,MOTRIN) 800 MG tablet Take 1 tablet (800 mg total) by mouth 3 (three) times daily. 21 tablet 0  . metFORMIN (GLUCOPHAGE) 1000 MG tablet Take 1 tablet (1,000 mg total) by mouth 2 (two) times daily for 14 days. 28 tablet 0  . Multiple Vitamin (MULTIVITAMIN WITH MINERALS) TABS Take 1 tablet by mouth daily.    . naproxen sodium (ANAPROX) 220 MG tablet Take 440 mg by mouth 2 (two) times daily with a meal.      No current facility-administered medications on file prior to visit.     BP 124/73   Pulse 86   Temp 97.7 F (36.5 C) (Oral)   Resp 16   Ht 5\' 9"  (1.753 m)   Wt 191 lb 14.4 oz (87 kg)   SpO2 100%   BMI 28.34 kg/m       Objective:   Physical Exam  General Mental Status- Alert. General Appearance- Not in acute distress.   Skin General: Color- Normal Color. Moisture- Normal Moisture.  Neck Carotid Arteries- Normal color. Moisture- Normal Moisture. No carotid bruits. No JVD.  Chest and Lung Exam Auscultation: Breath Sounds:-Normal.  Cardiovascular Auscultation:Rythm- Regular. Murmurs & Other Heart Sounds:Auscultation of the heart reveals- No Murmurs.  Abdomen Inspection:-Inspeection Normal. Palpation/Percussion:Note:No mass. Palpation and Percussion of the abdomen reveal- Non Tender, Non Distended + BS, no rebound or guarding.   Neurologic Cranial Nerve exam:- CN III-XII intact(No nystagmus), symmetric smile. Strength:- 5/5 equal and symmetric strength both upper and lower extremities.   Lower ext- see quality metrics      Assessment & Plan:  Nice to meet you today.  For your new diagnosis of diabetes, I want you to continue with metformin and I prescribed Jardiance as add-on.  I want you to call your insurance and see if they will send you a glucometer or get the preferred name of the glucometer that they cover.  Give me the name of the glucometer and I could send in prescription of a diabetic supplies.  I did go ahead and place referral for diabetic education.  Start eating a low sugar diet and getting some daily exercise.  We will get metabolic panel today and A1c.  Your reported fatigue might be associated with diabetes.  But will go ahead and add CBC, B12, B1, TSH and vitamin D to your lab work today.  Also added lipid panel to blood work to assess you have high cholesterol.  Even if your cholesterol is not very high it would likely be best for you to  start a statin low dose.  For snoring, I placed referral to pulmonologist.  Also did place referral to optometrist for diabetic eye exam.  Referral staff should be calling you to get the name of the optometrist that you have used in the past.  Follow-up in 3 to 4 weeks or as needed.  45 minutes was spent with patient.  It took longer than standard as this was a new patient visit as well as her emergency department follow-up appointment for uncontrolled diabetes.  [name], PA-C

## 2018-03-01 LAB — VITAMIN B1

## 2018-03-07 ENCOUNTER — Telehealth: Payer: Self-pay | Admitting: Medical

## 2018-03-07 NOTE — Telephone Encounter (Signed)
Tried to reach pt no answer and vm is not set up Okay for PC to give information.

## 2018-03-07 NOTE — Telephone Encounter (Signed)
I had asked to follow up in one month but if sugars are still high have him make appointment for earlier appointment. May need other med/treatment regimen. Can he give some numbers as to high what are his high numbers and what are lows?

## 2018-03-07 NOTE — Telephone Encounter (Signed)
Copied from CRM [id number]. Topic: General - Other >> Mar 07, 2018 10:38 AM Tamela Oddi wrote: Reason for CRM: Patient called to request that the nurse or doctor give him a call regarding his A1C levels.  On his last blood test, patient said it was high.  Patient stated that he has reduced his sugar intake and has questions on why it was still high.  Please call patient to discuss.  CB# [phone]

## 2018-03-20 ENCOUNTER — Encounter: Payer: Self-pay | Admitting: Medical

## 2018-03-20 ENCOUNTER — Ambulatory Visit (INDEPENDENT_AMBULATORY_CARE_PROVIDER_SITE_OTHER): Payer: BLUE CROSS/BLUE SHIELD | Admitting: Medical

## 2018-03-20 VITALS — BP 120/77 | HR 69 | Temp 97.6°F | Resp 16 | Ht 69.0 in | Wt 192.6 lb

## 2018-03-20 DIAGNOSIS — E119 Type 2 diabetes mellitus without complications: Secondary | ICD-10-CM

## 2018-03-20 DIAGNOSIS — E785 Hyperlipidemia, unspecified: Secondary | ICD-10-CM | POA: Diagnosis not present

## 2018-03-20 MED ORDER — METFORMIN HCL 1000 MG PO TABS: 1000.0000 mg | ORAL_TABLET | Freq: Two times a day (BID) | ORAL | 5 refills | Status: DC

## 2018-03-20 MED ORDER — EMPAGLIFLOZIN 10 MG PO TABS: 10.0000 mg | ORAL_TABLET | Freq: Every day | ORAL | 3 refills | Status: DC

## 2018-03-20 NOTE — Progress Notes (Signed)
Subjective:    Patient ID: Ethan Holt, male    DOB: 04-May-1960, 58 y.o.   MRN: [id number]  HPI  Pt in for follow up.  His sugars recently in low 100's. His readings are in low 100 and sometimes in 80.  Pt is eating a lot better. He is exercising after work. Walking even after work.  Pt informed to get diabetic eye exam. He sees optometrist yearly.   Pt declines flu vaccine.  Declined tdap today. He states will get on next visit.    Review of Systems  Constitutional: Negative for chills, fatigue and fever.  Respiratory: Negative for cough, chest tightness, shortness of breath and wheezing.   Cardiovascular: Negative for chest pain and palpitations.  Gastrointestinal: Negative for abdominal pain, constipation, diarrhea and nausea.  Genitourinary: Negative for decreased urine volume, difficulty urinating, flank pain, frequency, testicular pain and urgency.  Musculoskeletal: Negative for back pain, gait problem and neck pain.  Skin: Negative for rash.  Neurological: Negative for dizziness, weakness, numbness and headaches.  Hematological: Negative for adenopathy. Does not bruise/bleed easily.  Psychiatric/Behavioral: Negative for behavioral problems, confusion, dysphoric mood and self-injury.    Past Medical History:  Diagnosis Date  . Allergy    spring  . Asthma    as child   . Diabetes mellitus without complication [facility])      Social History   Socioeconomic History  . Marital status: Married    Spouse name: Not on file  . Number of children: Not on file  . Years of education: Not on file  . Highest education level: Not on file  Occupational History  . Not on file  Social Needs  . Financial resource strain: Not on file  . Food insecurity:    Worry: Not on file    Inability: Not on file  . Transportation needs:    Medical: Not on file    Non-medical: Not on file  Tobacco Use  . Smoking status: Never Smoker  . Smokeless tobacco: Current User  Substance and  Sexual Activity  . Alcohol use: No  . Drug use: No  . Sexual activity: Yes  Lifestyle  . Physical activity:    Days per week: Not on file    Minutes per session: Not on file  . Stress: Not on file  Relationships  . Social connections:    Talks on phone: Not on file    Gets together: Not on file    Attends religious service: Not on file    Active member of club or organization: Not on file    Attends meetings of clubs or organizations: Not on file    Relationship status: Not on file  . Intimate partner violence:    Fear of current or ex partner: Not on file    Emotionally abused: Not on file    Physically abused: Not on file    Forced sexual activity: Not on file  Other Topics Concern  . Not on file  Social History Narrative  . Not on file    No past surgical history on file.  No family history on file.  No Known Allergies  Current Outpatient Medications on File Prior to Visit  Medication Sig Dispense Refill  . atorvastatin (LIPITOR) 10 MG tablet Take 1 tablet (10 mg total) by mouth daily. 30 tablet 3  . Cyanocobalamin (VITAMIN B-12 PO) Take 2 tablets by mouth daily.     . empagliflozin (JARDIANCE) 10 MG TABS tablet Take 10 mg  by mouth daily. 30 tablet 3  . ibuprofen (ADVIL,MOTRIN) 800 MG tablet Take 1 tablet (800 mg total) by mouth 3 (three) times daily. 21 tablet 0  . Multiple Vitamin (MULTIVITAMIN WITH MINERALS) TABS Take 1 tablet by mouth daily.    . naproxen sodium (ANAPROX) 220 MG tablet Take 440 mg by mouth 2 (two) times daily with a meal.    . Vitamin D, Ergocalciferol, (DRISDOL) 1.25 MG (50000 UT) CAPS capsule Take 1 capsule (50,000 Units total) by mouth every 7 (seven) days. 8 capsule 0  . metFORMIN (GLUCOPHAGE) 1000 MG tablet Take 1 tablet (1,000 mg total) by mouth 2 (two) times daily for 14 days. 60 tablet 5  . metFORMIN (GLUCOPHAGE) 1000 MG tablet Take 1 tablet (1,000 mg total) by mouth 2 (two) times daily for 14 days. 28 tablet 0   No current  facility-administered medications on file prior to visit.     BP 120/77   Pulse 69   Temp 97.6 F (36.4 C) (Oral)   Resp 16   Ht 5\' 9"  (1.753 m)   Wt 192 lb 9.6 oz (87.4 kg)   SpO2 100%   BMI 28.44 kg/m       Objective:   Physical Exam  General Mental Status- Alert. General Appearance- Not in acute distress.   Skin General: Color- Normal Color. Moisture- Normal Moisture.  Neck Carotid Arteries- Normal color. Moisture- Normal Moisture. No carotid bruits. No JVD.  Chest and Lung Exam Auscultation: Breath Sounds:-Normal.  Cardiovascular Auscultation:Rythm- Regular. Murmurs & Other Heart Sounds:Auscultation of the heart reveals- No Murmurs.  Abdomen Inspection:-Inspeection Normal. Palpation/Percussion:Note:No mass. Palpation and Percussion of the abdomen reveal- Non Tender, Non Distended + BS, no rebound or guarding.    Neurologic Cranial Nerve exam:- CN III-XII intact(No nystagmus), symmetric smile. Strength:- 5/5 equal and symmetric strength both upper and lower extremities.      Assessment & Plan:  Your blood sugars are recently much better than your three-month average showed.  Sounds like combination of the metformin and Jardiance is helping a lot.  Continue check your blood sugars in the morning and before you go to sleep.  If you have any lightheaded, dizziness or blurred vision check your blood sugars to make sure that you are not having any low blood sugar events(less than 70).  I your sugars were on average 350 on recent labs.  You have made a lot of lifestyle modifications.  So it would be unlikely real low sugars but we need to make sure you not having low sugar events.  If so we discuss beverages and foods that would bring sugar up quickly.  You do have high cholesterol and I did send in atorvastatin to your pharmacy.  Counseled on benefit of this in light of your diabetic diagnosis.  Follow-up a little bit later than April 5.  Or as needed.   [name], PA-C

## 2018-03-20 NOTE — Patient Instructions (Addendum)
Your blood sugars are recently much better than your three-month average showed.  Sounds like combination of the metformin and Jardiance is helping a lot.  Continue check your blood sugars in the morning and before you go to sleep.  If you have any lightheaded, dizziness or blurred vision check your blood sugars to make sure that you are not having any low blood sugar events(less than 70).  I your sugars were on average 350 on recent labs.  You have made a lot of lifestyle modifications.  So it would be unlikely real low sugars but we need to make sure you not having low sugar events.  If so we discuss beverages and foods that would bring sugar up quickly.  You do have high cholesterol and I did send in atorvastatin to your pharmacy.  Counseled on benefit of this in light of your diabetic diagnosis.  Follow-up a little bit later than April 5.  Or as needed.

## 2018-03-27 ENCOUNTER — Ambulatory Visit: Payer: BLUE CROSS/BLUE SHIELD | Admitting: Registered"

## 2018-04-03 ENCOUNTER — Institutional Professional Consult (permissible substitution): Payer: BLUE CROSS/BLUE SHIELD | Admitting: Pulmonary Disease

## 2018-05-01 ENCOUNTER — Other Ambulatory Visit: Payer: Self-pay | Admitting: Medical

## 2018-05-01 MED ORDER — EMPAGLIFLOZIN 10 MG PO TABS: 10.0000 mg | ORAL_TABLET | Freq: Every day | ORAL | 0 refills | Status: DC

## 2018-05-01 NOTE — Telephone Encounter (Signed)
Copied from CRM [id number]. Topic: Quick Communication - Rx Refill/Question >> May 01, 2018  9:10 AM Baldo Daub L wrote: Medication: empagliflozin (JARDIANCE) 10 MG TABS tablet  Has the patient contacted their pharmacy? Yes - no refills left.  Pt states that he is completely out.  Pt would like a call when this has been called in [phone]. (Agent: If no, request that the patient contact the pharmacy for the refill.) (Agent: If yes, when and what did the pharmacy advise?)  Preferred Pharmacy (with phone number or street name): [id number] [place], [place] - [phone] AT [facility] OF [address] & [address] [phone] (Phone) [phone] (Fax)  Agent: Please be advised that RX refills may take up to 3 business days. We ask that you follow-up with your pharmacy.

## 2018-05-01 NOTE — Telephone Encounter (Signed)
Requested Prescriptions  Pending Prescriptions Disp Refills  . empagliflozin (JARDIANCE) 10 MG TABS tablet 90 tablet 0    Sig: Take 10 mg by mouth daily.     Endocrinology:  Diabetes - SGLT2 Inhibitors Failed - 05/01/2018  9:13 AM      Failed - LDL in normal range and within 360 days    LDL Cholesterol  Date Value Ref Range Status  02/27/2018 146 (H) 0 - 99 mg/dL Final         Failed - HBA1C is between 0 and 7.9 and within 180 days    Hgb A1c MFr Bld  Date Value Ref Range Status  02/27/2018 14.9 (H) 4.6 - 6.5 % Final    Comment:    Glycemic Control Guidelines for People with Diabetes:Non Diabetic:  <6%Goal of Therapy: <7%Additional Action Suggested:  >8%          Passed - Cr in normal range and within 360 days    Creatinine, Ser  Date Value Ref Range Status  02/27/2018 1.19 0.40 - 1.50 mg/dL Final         Passed - eGFR in normal range and within 360 days    GFR calc Af Amer  Date Value Ref Range Status  02/23/2018 >60 >60 mL/min Final   GFR calc non Af Amer  Date Value Ref Range Status  02/23/2018 55 (L) >60 mL/min Final   GFR  Date Value Ref Range Status  02/27/2018 80.46 >60.00 mL/min Final         Passed - Valid encounter within last 6 months    Recent Outpatient Visits          1 month ago Controlled type 2 diabetes mellitus without complication, without long-term current use of insulin ([place])   Archivist at [place], PA-C   2 months ago Controlled type 2 diabetes mellitus without complication, without long-term current use of insulin ([place])   Archivist at [place], [employer]            In 1 month [place], Percell Miller, PA-C [employer] at [employer], [facility]         Next appt. 06/09/18

## 2018-06-09 ENCOUNTER — Encounter: Payer: Self-pay | Admitting: Medical

## 2018-06-09 ENCOUNTER — Other Ambulatory Visit: Payer: Self-pay

## 2018-06-09 ENCOUNTER — Ambulatory Visit (INDEPENDENT_AMBULATORY_CARE_PROVIDER_SITE_OTHER): Payer: BLUE CROSS/BLUE SHIELD | Admitting: Medical

## 2018-06-09 VITALS — Temp 97.9°F

## 2018-06-09 DIAGNOSIS — E119 Type 2 diabetes mellitus without complications: Secondary | ICD-10-CM | POA: Diagnosis not present

## 2018-06-09 DIAGNOSIS — E785 Hyperlipidemia, unspecified: Secondary | ICD-10-CM

## 2018-06-09 DIAGNOSIS — E559 Vitamin D deficiency, unspecified: Secondary | ICD-10-CM | POA: Diagnosis not present

## 2018-06-09 NOTE — Patient Instructions (Addendum)
Your blood sugar is much improved compared to your high A1c of 14.9.  Continue Jardiance, low sugar diet and exercise.  We will get A1c this coming Friday and hopefully confirm that your sugars are on average tightly controlled.  He do report occasional low blood sugar but are asymptomatic.  If you do have any symptoms would recommend that you check your sugar.  For any low sugar events advised eating a snack to bring sugar up.  For high cholesterol, continue with the Lipitor, low-cholesterol diet and exercise.  We will check your lipid panel and metabolic panel this coming Friday.  Notify you of those results.  For low vitamin D, he has been on vitamin D supplement but will check your vitamin D level this Friday and let you know if you need further supplementation or if you can just restart over-the-counter tablets.  Follow-up date to be determined after lab review.

## 2018-06-09 NOTE — Progress Notes (Signed)
   Subjective:    Patient ID: Ethan Holt, male    DOB: 1960-04-20, 58 y.o.   MRN: [id number]  HPI  Virtual Visit via Video Note  I connected with Ethan Holt on 06/09/18 at  8:00 AM EDT by a video enabled telemedicine application and verified that I am speaking with the correct person using two identifiers.   I discussed the limitations of evaluation and management by telemedicine and the availability of in person appointments. The patient expressed understanding and agreed to proceed.     History of Present Illness:  Pt a1-c was high in past up in 14.9 range. Pt checks his sugars with wife machine and his sugar are 60-140. Rare lower end but also rare near 140.  Pt is eating healthier and exercising.  Low sugar and low cholesterol diet.  Vitamin D level was low in past.     Observations/Objective:  No acute distress.   Assessment and Plan: Your blood sugar is much improved compared to your high A1c of 14.9.  Continue Jardiance, low sugar diet and exercise.  We will get A1c this coming Friday and hopefully confirm that your sugars are on average tightly controlled.  He do report occasional low blood sugar but are asymptomatic.  If you do have any symptoms would recommend that you check your sugar.  For any low sugar events advised eating a snack to bring sugar up.  For high cholesterol, continue with the Lipitor, low-cholesterol diet and exercise.  We will check your lipid panel and metabolic panel this coming Friday.  Notify you of those results.  For low vitamin D, he has been on vitamin D supplement but will check your vitamin D level this Friday and let you know if you need further supplementation or if you can just restart over-the-counter tablets.  Follow-up date to be determined after lab review.  Follow Up Instructions:    I discussed the assessment and treatment plan with the patient. The patient was provided an opportunity to ask questions and all were answered.  The patient agreed with the plan and demonstrated an understanding of the instructions.   The patient was advised to call back or seek an in-person evaluation if the symptoms worsen or if the condition fails to improve as anticipated.  I provided 16 minutes of non-face-to-face time during this encounter.   [name], PA-C    Review of Systems  Constitutional: Negative for chills, fatigue and fever.  HENT: Negative for congestion and ear discharge.   Respiratory: Negative for cough, chest tightness, shortness of breath and wheezing.   Cardiovascular: Negative for chest pain and palpitations.  Gastrointestinal: Negative for abdominal pain.  Musculoskeletal: Negative for back pain.  Skin: Negative for rash.  Neurological: Negative for dizziness, seizures, syncope, facial asymmetry, weakness and light-headedness.  Hematological: Negative for adenopathy. Does not bruise/bleed easily.  Psychiatric/Behavioral: Negative for behavioral problems, confusion, dysphoric mood, self-injury and suicidal ideas. The patient is not nervous/anxious.        Objective:   Physical Exam No acute distress on video.       Assessment & Plan:

## 2018-06-13 ENCOUNTER — Other Ambulatory Visit: Payer: Self-pay

## 2018-06-13 ENCOUNTER — Other Ambulatory Visit (INDEPENDENT_AMBULATORY_CARE_PROVIDER_SITE_OTHER): Payer: BLUE CROSS/BLUE SHIELD

## 2018-06-13 DIAGNOSIS — E119 Type 2 diabetes mellitus without complications: Secondary | ICD-10-CM

## 2018-06-13 DIAGNOSIS — E785 Hyperlipidemia, unspecified: Secondary | ICD-10-CM | POA: Diagnosis not present

## 2018-06-13 DIAGNOSIS — E559 Vitamin D deficiency, unspecified: Secondary | ICD-10-CM | POA: Diagnosis not present

## 2018-06-13 LAB — LIPID PANEL
Cholesterol: 133 mg/dL (ref 0–200)
HDL: 42.3 mg/dL (ref >39.00)
LDL Cholesterol: 83 mg/dL (ref 0–99)
NonHDL: 91.01
Total CHOL/HDL Ratio: 3
Triglycerides: 41 mg/dL (ref 0.0–149.0)
VLDL: 8.2 mg/dL (ref 0.0–40.0)

## 2018-06-13 LAB — COMPREHENSIVE METABOLIC PANEL
ALT: 32 U/L (ref 0–53)
AST: 21 U/L (ref 0–37)
Albumin: 4.4 g/dL (ref 3.5–5.2)
Alkaline Phosphatase: 63 U/L (ref 39–117)
BUN: 23 mg/dL (ref 6–23)
CO2: 29 mEq/L (ref 19–32)
Calcium: 10.3 mg/dL (ref 8.4–10.5)
Chloride: 104 mEq/L (ref 96–112)
Creatinine, Ser: 1.26 mg/dL (ref 0.40–1.50)
GFR: 70.8 mL/min (ref >60.00)
Glucose, Bld: 87 mg/dL (ref 70–99)
Potassium: 4.9 mEq/L (ref 3.5–5.1)
Sodium: 142 mEq/L (ref 135–145)
Total Bilirubin: 1 mg/dL (ref 0.2–1.2)
Total Protein: 7.2 g/dL (ref 6.0–8.3)

## 2018-06-13 LAB — HEMOGLOBIN A1C: Hgb A1c MFr Bld: 7.4 % — ABNORMAL HIGH (ref 4.6–6.5)

## 2018-06-16 ENCOUNTER — Telehealth: Payer: Self-pay | Admitting: Medical

## 2018-06-16 NOTE — Telephone Encounter (Signed)
Pt given results per notes of [name] PA on 06/13/18 Unable to document in result note due to result note not being routed to [facility]. Appt made.

## 2018-06-18 LAB — VITAMIN D 1,25 DIHYDROXY
Vitamin D 1, 25 (OH)2 Total: 43 pg/mL (ref 18–72)
Vitamin D2 1, 25 (OH)2: <8 pg/mL
Vitamin D3 1, 25 (OH)2: 43 pg/mL

## 2018-06-24 ENCOUNTER — Institutional Professional Consult (permissible substitution): Payer: BLUE CROSS/BLUE SHIELD | Admitting: Pulmonary Disease

## 2018-09-01 ENCOUNTER — Other Ambulatory Visit: Payer: Self-pay

## 2018-09-01 MED ORDER — ATORVASTATIN CALCIUM 10 MG PO TABS: 10.0000 mg | ORAL_TABLET | Freq: Every day | ORAL | 3 refills | Status: DC

## 2018-09-19 ENCOUNTER — Ambulatory Visit: Payer: BLUE CROSS/BLUE SHIELD | Admitting: Medical

## 2018-09-25 ENCOUNTER — Ambulatory Visit (INDEPENDENT_AMBULATORY_CARE_PROVIDER_SITE_OTHER): Payer: BC Managed Care – PPO | Admitting: Medical

## 2018-09-25 ENCOUNTER — Other Ambulatory Visit: Payer: Self-pay

## 2018-09-25 ENCOUNTER — Encounter: Payer: Self-pay | Admitting: Medical

## 2018-09-25 ENCOUNTER — Telehealth: Payer: Self-pay | Admitting: Medical

## 2018-09-25 VITALS — BP 115/78 | HR 66 | Temp 98.2°F | Resp 16 | Ht 69.0 in | Wt 193.4 lb

## 2018-09-25 DIAGNOSIS — E785 Hyperlipidemia, unspecified: Secondary | ICD-10-CM

## 2018-09-25 DIAGNOSIS — E119 Type 2 diabetes mellitus without complications: Secondary | ICD-10-CM | POA: Diagnosis not present

## 2018-09-25 LAB — COMPREHENSIVE METABOLIC PANEL
ALT: 52 U/L (ref 0–53)
AST: 35 U/L (ref 0–37)
Albumin: 4.4 g/dL (ref 3.5–5.2)
Alkaline Phosphatase: 64 U/L (ref 39–117)
BUN: 17 mg/dL (ref 6–23)
CO2: 29 mEq/L (ref 19–32)
Calcium: 9.9 mg/dL (ref 8.4–10.5)
Chloride: 104 mEq/L (ref 96–112)
Creatinine, Ser: 1.18 mg/dL (ref 0.40–1.50)
GFR: 76.29 mL/min (ref >60.00)
Glucose, Bld: 106 mg/dL — ABNORMAL HIGH (ref 70–99)
Potassium: 4.7 mEq/L (ref 3.5–5.1)
Sodium: 140 mEq/L (ref 135–145)
Total Bilirubin: 0.9 mg/dL (ref 0.2–1.2)
Total Protein: 7 g/dL (ref 6.0–8.3)

## 2018-09-25 LAB — LIPID PANEL
Cholesterol: 121 mg/dL (ref 0–200)
HDL: 40.2 mg/dL (ref >39.00)
LDL Cholesterol: 74 mg/dL (ref 0–99)
NonHDL: 81.16
Total CHOL/HDL Ratio: 3
Triglycerides: 36 mg/dL (ref 0.0–149.0)
VLDL: 7.2 mg/dL (ref 0.0–40.0)

## 2018-09-25 LAB — HEMOGLOBIN A1C: Hgb A1c MFr Bld: 6.9 % — ABNORMAL HIGH (ref 4.6–6.5)

## 2018-09-25 NOTE — Telephone Encounter (Signed)
empagliflozin (JARDIANCE) 10 MG TABS tablet [[id number]]   Patient request for refill to be sent to [place]

## 2018-09-25 NOTE — Progress Notes (Signed)
Subjective:    Patient ID: Ethan Holt, male    DOB: Nov 12, 1960, 58 y.o.   MRN: [id number]  HPI  Pt in for follow up.  His sugars have been running 90-115 most of time. He mentioned one day spiked to 135. Pt states he has been exercising. He has been lifting weights and running. Pt job is delivery heavy household items. Pt is on jardiance. He is also eating less sugar.  Pt is on artovastatin and his last lipid panel is controlled.     Review of Systems  Constitutional: Negative for chills, fatigue and fever.  HENT: Negative for congestion and drooling.   Respiratory: Negative for cough, chest tightness and shortness of breath.   Cardiovascular: Negative for chest pain and palpitations.  Gastrointestinal: Negative for abdominal pain, constipation, rectal pain and vomiting.  Musculoskeletal: Negative for arthralgias and back pain.  Skin: Negative for rash.  Neurological: Negative for dizziness, seizures, speech difficulty, weakness and headaches.  Hematological: Negative for adenopathy. Does not bruise/bleed easily.  Psychiatric/Behavioral: Negative for behavioral problems and confusion. The patient is not nervous/anxious.     Past Medical History:  Diagnosis Date  . Allergy    spring  . Asthma    as child   . Diabetes mellitus without complication [facility](HCC)      Social History   Socioeconomic History  . Marital status: Married    Spouse name: Not on file  . Number of children: Not on file  . Years of education: Not on file  . Highest education level: Not on file  Occupational History  . Not on file  Social Needs  . Financial resource strain: Not on file  . Food insecurity    Worry: Not on file    Inability: Not on file  . Transportation needs    Medical: Not on file    Non-medical: Not on file  Tobacco Use  . Smoking status: Never Smoker  . Smokeless tobacco: Current User  Substance and Sexual Activity  . Alcohol use: No  . Drug use: No  . Sexual activity:  Yes  Lifestyle  . Physical activity    Days per week: Not on file    Minutes per session: Not on file  . Stress: Not on file  Relationships  . Social Musicianconnections    Talks on phone: Not on file    Gets together: Not on file    Attends religious service: Not on file    Active member of club or organization: Not on file    Attends meetings of clubs or organizations: Not on file    Relationship status: Not on file  . Intimate partner violence    Fear of current or ex partner: Not on file    Emotionally abused: Not on file    Physically abused: Not on file    Forced sexual activity: Not on file  Other Topics Concern  . Not on file  Social History Narrative  . Not on file    No past surgical history on file.  No family history on file.  No Known Allergies  Current Outpatient Medications on File Prior to Visit  Medication Sig Dispense Refill  . atorvastatin (LIPITOR) 10 MG tablet Take 1 tablet (10 mg total) by mouth daily. 30 tablet 3  . Cyanocobalamin (VITAMIN B-12 PO) Take 2 tablets by mouth daily.     . empagliflozin (JARDIANCE) 10 MG TABS tablet Take 10 mg by mouth daily. 90 tablet 0  .  ibuprofen (ADVIL,MOTRIN) 800 MG tablet Take 1 tablet (800 mg total) by mouth 3 (three) times daily. 21 tablet 0  . Multiple Vitamin (MULTIVITAMIN WITH MINERALS) TABS Take 1 tablet by mouth daily.    Marland Kitchen OVER THE COUNTER MEDICATION Take 2 capsules by mouth daily. NUGENIX    . Vitamin D, Ergocalciferol, (DRISDOL) 1.25 MG (50000 UT) CAPS capsule Take 1 capsule (50,000 Units total) by mouth every 7 (seven) days. 8 capsule 0   No current facility-administered medications on file prior to visit.     BP 115/78   Pulse 66   Temp 98.2 F (36.8 C) (Oral)   Resp 16   Ht 5\' 9"  (1.753 m)   Wt 193 lb 6.4 oz (87.7 kg)   SpO2 100%   BMI 28.56 kg/m       Objective:   Physical Exam  General Mental Status- Alert. General Appearance- Not in acute distress.   Skin General: Color- Normal Color.  Moisture- Normal Moisture.  Neck Carotid Arteries- Normal color. Moisture- Normal Moisture. No carotid bruits. No JVD.  Chest and Lung Exam Auscultation: Breath Sounds:-Normal.  Cardiovascular Auscultation:Rythm- Regular. Murmurs & Other Heart Sounds:Auscultation of the heart reveals- No Murmurs.  Abdomen Inspection:-Inspeection Normal. Palpation/Percussion:Note:No mass. Palpation and Percussion of the abdomen reveal- Non Tender, Non Distended + BS, no rebound or guarding.    Neurologic Cranial Nerve exam:- CN III-XII intact(No nystagmus), symmetric smile. Strength:- 5/5 equal and symmetric strength both upper and lower extremities.   Lower ext- see quality metrics.     Assessment & Plan:  Good job on low sugar diet and exercise. Will get cmp, lipid panel, a1c and urine micro albumin today.  Last a1c much improved and hopefully still low/little better.  Last lipid panel showed good control. Follow labs.  Reminder recommend flu vaccine in sept.  Follow up date to be determined after lab review.  25 minutes spent with pt today. 50% of time spent with pt discussing current conditions and plan going forward.  Mackie Pai, PA-C

## 2018-09-25 NOTE — Patient Instructions (Signed)
Good job on low sugar diet and exercise. Will get cmp, lipid panel, a1c and urine micro albumin today.  Last a1c much improved and hopefully still low/little better.  Last lipid panel showed good control. Follow labs.  Reminder recommend flu vaccine in sept.  Follow up date to be determined after lab review.

## 2018-09-26 LAB — MICROALBUMIN, URINE: Microalb, Ur: 0.4 mg/dL

## 2018-11-19 ENCOUNTER — Other Ambulatory Visit: Payer: Self-pay | Admitting: Medical

## 2018-11-19 MED ORDER — EMPAGLIFLOZIN 10 MG PO TABS: 10.0000 mg | ORAL_TABLET | Freq: Every day | ORAL | 0 refills | Status: DC

## 2018-11-19 MED ORDER — ATORVASTATIN CALCIUM 10 MG PO TABS: 10.0000 mg | ORAL_TABLET | Freq: Every day | ORAL | 3 refills | Status: DC

## 2018-11-19 NOTE — Telephone Encounter (Signed)
Medication Refill - Medication: empagliflozin (JARDIANCE) 10 MG TABS tablet, atorvastatin (LIPITOR) 10 MG tablet     Has the patient contacted their pharmacy? Yes.   PT called stating he is completely out of these medications. Please advise.  (Agent: If no, request that the patient contact the pharmacy for the refill.) (Agent: If yes, when and what did the pharmacy advise?)  Preferred Pharmacy (with phone number or street name):  [id number] [place], [place]  [address] [place] [address]  Phone: [phone] Fax: [phone]  Not a 24 hour pharmacy; exact hours not known.     Agent: Please be advised that RX refills may take up to 3 business days. We ask that you follow-up with your pharmacy.

## 2019-02-09 ENCOUNTER — Other Ambulatory Visit: Payer: Self-pay

## 2019-02-09 DIAGNOSIS — Z20822 Contact with and (suspected) exposure to covid-19: Secondary | ICD-10-CM

## 2019-02-11 LAB — NOVEL CORONAVIRUS, NAA: SARS-CoV-2, NAA: DETECTED — AB

## 2019-02-12 ENCOUNTER — Encounter: Payer: Self-pay | Admitting: Unknown Physician Specialty

## 2019-02-18 ENCOUNTER — Other Ambulatory Visit: Payer: Self-pay | Admitting: Medical

## 2019-05-14 ENCOUNTER — Other Ambulatory Visit: Payer: Self-pay | Admitting: Medical

## 2019-07-02 ENCOUNTER — Encounter: Payer: Self-pay | Admitting: Medical

## 2019-07-02 ENCOUNTER — Other Ambulatory Visit: Payer: Self-pay

## 2019-07-02 ENCOUNTER — Ambulatory Visit (INDEPENDENT_AMBULATORY_CARE_PROVIDER_SITE_OTHER): Payer: BC Managed Care – PPO | Admitting: Medical

## 2019-07-02 VITALS — BP 119/81 | HR 77 | Temp 97.0°F | Resp 18 | Ht 69.0 in | Wt 196.6 lb

## 2019-07-02 DIAGNOSIS — Z125 Encounter for screening for malignant neoplasm of prostate: Secondary | ICD-10-CM | POA: Diagnosis not present

## 2019-07-02 DIAGNOSIS — E785 Hyperlipidemia, unspecified: Secondary | ICD-10-CM | POA: Diagnosis not present

## 2019-07-02 DIAGNOSIS — E119 Type 2 diabetes mellitus without complications: Secondary | ICD-10-CM

## 2019-07-02 DIAGNOSIS — M5431 Sciatica, right side: Secondary | ICD-10-CM

## 2019-07-02 LAB — COMPREHENSIVE METABOLIC PANEL
ALT: 45 U/L (ref 0–53)
AST: 22 U/L (ref 0–37)
Albumin: 4.2 g/dL (ref 3.5–5.2)
Alkaline Phosphatase: 64 U/L (ref 39–117)
BUN: 14 mg/dL (ref 6–23)
CO2: 28 mEq/L (ref 19–32)
Calcium: 9.5 mg/dL (ref 8.4–10.5)
Chloride: 105 mEq/L (ref 96–112)
Creatinine, Ser: 1.21 mg/dL (ref 0.40–1.50)
GFR: 73.92 mL/min (ref >60.00)
Glucose, Bld: 120 mg/dL — ABNORMAL HIGH (ref 70–99)
Potassium: 4.6 mEq/L (ref 3.5–5.1)
Sodium: 138 mEq/L (ref 135–145)
Total Bilirubin: 0.7 mg/dL (ref 0.2–1.2)
Total Protein: 6.9 g/dL (ref 6.0–8.3)

## 2019-07-02 LAB — LIPID PANEL
Cholesterol: 180 mg/dL (ref 0–200)
HDL: 42.6 mg/dL (ref >39.00)
LDL Cholesterol: 128 mg/dL — ABNORMAL HIGH (ref 0–99)
NonHDL: 137.14
Total CHOL/HDL Ratio: 4
Triglycerides: 47 mg/dL (ref 0.0–149.0)
VLDL: 9.4 mg/dL (ref 0.0–40.0)

## 2019-07-02 LAB — HEMOGLOBIN A1C: Hgb A1c MFr Bld: 7.8 % — ABNORMAL HIGH (ref 4.6–6.5)

## 2019-07-02 LAB — PSA: PSA: 0.91 ng/mL (ref 0.10–4.00)

## 2019-07-02 NOTE — Patient Instructions (Addendum)
Your sciatica is resolved. If reoccurs can use low dose ibuprofen and back stretching exercises. If persists or severe then can get lumbar xray.  For diabetes will get a1c and continue jardiance.  For high cholesterol check lipid panel. Continue atorvastatin.  For screening prostate cancer psa today.  Counseled benefit and risk of covid vaccine.  Follow up date to be determined after lab review.

## 2019-07-02 NOTE — Progress Notes (Signed)
Subjective:    Patient ID: Ethan Holt, male    DOB: 1960/04/04, 59 y.o.   MRN: [id number]  HPI  Pt in with some recent back pain. He has lower back pain for about one week. Yesterday morning pain was moderate to severe. He described pain as being in rt si area with some pain radiating to rt buttock area.  He states pain was dull and mild for about a week.   States yesterday took ibuprofen yesterday. States pain went away completely. None present today.  Pt also has questions on last colonoscopy. I don't see that in epic.  Pt state 2-3 years since had prostate studies.  Hx of high cholesterol. On lipitor  Hx of diabetes. On jardiance.   Review of Systems  Constitutional: Negative for chills, fatigue and fever.  Respiratory: Negative for cough, chest tightness, shortness of breath and wheezing.   Cardiovascular: Negative for chest pain and palpitations.  Gastrointestinal: Negative for abdominal pain.  Genitourinary: Negative for frequency.  Musculoskeletal: Negative for back pain.  Skin: Negative for rash.  Neurological: Negative for dizziness and headaches.  Hematological: Negative for adenopathy. Does not bruise/bleed easily.  Psychiatric/Behavioral: Negative for behavioral problems and decreased concentration.    Past Medical History:  Diagnosis Date  . Allergy    spring  . Asthma    as child   . Diabetes mellitus without complication [facility])      Social History   Socioeconomic History  . Marital status: Married    Spouse name: Not on file  . Number of children: Not on file  . Years of education: Not on file  . Highest education level: Not on file  Occupational History  . Not on file  Tobacco Use  . Smoking status: Never Smoker  . Smokeless tobacco: Current User  Substance and Sexual Activity  . Alcohol use: No  . Drug use: No  . Sexual activity: Yes  Other Topics Concern  . Not on file  Social History Narrative  . Not on file   Social Determinants  of Health   Financial Resource Strain:   . Difficulty of Paying Living Expenses:   Food Insecurity:   . Worried About Programme researcher, broadcasting/film/video in the Last Year:   . Barista in the Last Year:   Transportation Needs:   . Freight forwarder (Medical):   Marland Kitchen Lack of Transportation (Non-Medical):   Physical Activity:   . Days of Exercise per Week:   . Minutes of Exercise per Session:   Stress:   . Feeling of Stress :   Social Connections:   . Frequency of Communication with Friends and Family:   . Frequency of Social Gatherings with Friends and Family:   . Attends Religious Services:   . Active Member of Clubs or Organizations:   . Attends Banker Meetings:   Marland Kitchen Marital Status:   Intimate Partner Violence:   . Fear of Current or Ex-Partner:   . Emotionally Abused:   Marland Kitchen Physically Abused:   . Sexually Abused:     No past surgical history on file.  No family history on file.  No Known Allergies  Current Outpatient Medications on File Prior to Visit  Medication Sig Dispense Refill  . atorvastatin (LIPITOR) 10 MG tablet TAKE 1 TABLET BY MOUTH EVERY DAY 90 tablet 1  . Cyanocobalamin (VITAMIN B-12 PO) Take 2 tablets by mouth daily.     Marland Kitchen ibuprofen (ADVIL,MOTRIN) 800 MG tablet Take  1 tablet (800 mg total) by mouth 3 (three) times daily. 21 tablet 0  . JARDIANCE 10 MG TABS tablet TAKE 1 TABLET BY MOUTH EVERY DAY 90 tablet 0  . Multiple Vitamin (MULTIVITAMIN WITH MINERALS) TABS Take 1 tablet by mouth daily.    Marland Kitchen OVER THE COUNTER MEDICATION Take 2 capsules by mouth daily. NUGENIX    . Vitamin D, Ergocalciferol, (DRISDOL) 1.25 MG (50000 UT) CAPS capsule Take 1 capsule (50,000 Units total) by mouth every 7 (seven) days. 8 capsule 0   No current facility-administered medications on file prior to visit.    BP 119/81 (BP Location: Right Arm, Patient Position: Sitting, Cuff Size: Large)   Pulse 77   Temp (!) 97 F (36.1 C) (Temporal)   Resp 18   Ht 5\' 9"  (1.753 m)    Wt 196 lb 9.6 oz (89.2 kg)   SpO2 98%   BMI 29.03 kg/m       Objective:   Physical Exam  General Appearance- Not in acute distress.    Chest and Lung Exam Auscultation: Breath sounds:-Normal. Clear even and unlabored. Adventitious sounds:- No Adventitious sounds.  Cardiovascular Auscultation:Rythm - Regular, rate and rythm. Heart Sounds -Normal heart sounds.  Abdomen Inspection:-Inspection Normal.  Palpation/Perucssion: Palpation and Percussion of the abdomen reveal- Non Tender, No Rebound tenderness, No rigidity(Guarding) and No Palpable abdominal masses.  Liver:-Normal.  Spleen:- Normal.   Back No Mid lumbar spine tenderness to palpation. No pain on straight leg lift. Pain on lateral movements and flexion/extension of the spine. No si tenderness.  Rt hip- no pain on rom.  Lower ext neurologic  L5-S1 sensation intact bilaterally. Normal patellar reflexes bilaterally. No foot drop bilaterally.      Assessment & Plan:  Your sciatica is resolved. If reoccurs can use low dose ibuprofen and back stretching exercises. If persists or severe then can get lumbar xray.  For diabetes will get a1c and continue jardiance  For high cholesterol check lipid panel. Continue atorvastatin.  For screening prostate cancer psa today.  Counseled benefit and risk of covid vaccine.  Follow up date to be determined after lab review.  Time spent with patient today was  30 minutes which consisted of chart revdiew, discussing diagnosis, work up ,treatment and documentation.  Mackie Pai, PA-C

## 2019-07-03 ENCOUNTER — Telehealth: Payer: Self-pay | Admitting: Medical

## 2019-07-03 MED ORDER — ONGLYZA 2.5 MG PO TABS: 2.5000 mg | ORAL_TABLET | Freq: Every day | ORAL | 3 refills | Status: DC

## 2019-07-03 NOTE — Telephone Encounter (Signed)
Onglyza rx sent to pt pharamacy.

## 2019-07-17 ENCOUNTER — Other Ambulatory Visit: Payer: Self-pay

## 2019-07-17 ENCOUNTER — Encounter: Payer: Self-pay | Admitting: Medical

## 2019-07-17 ENCOUNTER — Ambulatory Visit (INDEPENDENT_AMBULATORY_CARE_PROVIDER_SITE_OTHER): Payer: BC Managed Care – PPO | Admitting: Medical

## 2019-07-17 VITALS — BP 122/71 | HR 73 | Temp 97.8°F | Resp 16 | Ht 69.0 in | Wt 203.8 lb

## 2019-07-17 DIAGNOSIS — M5431 Sciatica, right side: Secondary | ICD-10-CM

## 2019-07-17 MED ORDER — DICLOFENAC SODIUM 75 MG PO TBEC
75.0000 mg | DELAYED_RELEASE_TABLET | Freq: Two times a day (BID) | ORAL | 0 refills | Status: DC
Start: 2019-07-17 — End: 2021-04-11

## 2019-07-17 MED ORDER — CYCLOBENZAPRINE HCL 10 MG PO TABS
10.0000 mg | ORAL_TABLET | Freq: Every day | ORAL | 0 refills | Status: DC
Start: 2019-07-17 — End: 2021-04-11

## 2019-07-17 NOTE — Patient Instructions (Addendum)
You have rt sciatic region pain with some nerve pain toward gluteus area. Also some pain in hip area which is likely muscular.  I will rx diclofenac for pain and inflamation. Will rx flexeril low dose.  Gradually ease into activity again as described.  If pain persists or linger consider sports med referral or imaging. Not indicated presently.  Follow up in 7-10 days or as needed

## 2019-07-17 NOTE — Progress Notes (Signed)
Subjective:    Patient ID: Ethan Holt, male    DOB: 27-Dec-1960, 59 y.o.   MRN: [id number]  HPI  Pt in with rt side pain. Yesterday playing soft ball and he swung the bat and had moderate to severe acute onset of pain. He had to quit playing due to pain. Then pain eased up. Better today than yesterday. He used epsom salt bath, biofreeze and ibuprofen.  Pt states former sciatic pain  Resolved in recent past but recurrent pain  yesterday.   When he simulates swing of bat feels pain in rt si area, some in rt anterior superior spine region and some in bottom of buttux.    Review of Systems  Constitutional: Negative for chills, fatigue and fever.  Respiratory: Negative for cough, chest tightness, shortness of breath and wheezing.   Cardiovascular: Negative for chest pain and palpitations.  Gastrointestinal: Negative for abdominal pain.  Musculoskeletal:       See hpi.  Skin: Negative for rash.  Psychiatric/Behavioral: Negative for behavioral problems and dysphoric mood.    Past Medical History:  Diagnosis Date  . Allergy    spring  . Asthma    as child   . Diabetes mellitus without complication [facility])      Social History   Socioeconomic History  . Marital status: Married    Spouse name: Not on file  . Number of children: Not on file  . Years of education: Not on file  . Highest education level: Not on file  Occupational History  . Not on file  Tobacco Use  . Smoking status: Never Smoker  . Smokeless tobacco: Current User  Substance and Sexual Activity  . Alcohol use: No  . Drug use: No  . Sexual activity: Yes  Other Topics Concern  . Not on file  Social History Narrative  . Not on file   Social Determinants of Health   Financial Resource Strain:   . Difficulty of Paying Living Expenses:   Food Insecurity:   . Worried About Charity fundraiser in the Last Year:   . Arboriculturist in the Last Year:   Transportation Needs:   . Film/video editor  (Medical):   Marland Kitchen Lack of Transportation (Non-Medical):   Physical Activity:   . Days of Exercise per Week:   . Minutes of Exercise per Session:   Stress:   . Feeling of Stress :   Social Connections:   . Frequency of Communication with Friends and Family:   . Frequency of Social Gatherings with Friends and Family:   . Attends Religious Services:   . Active Member of Clubs or Organizations:   . Attends Archivist Meetings:   Marland Kitchen Marital Status:   Intimate Partner Violence:   . Fear of Current or Ex-Partner:   . Emotionally Abused:   Marland Kitchen Physically Abused:   . Sexually Abused:     No past surgical history on file.  No family history on file.  No Known Allergies  Current Outpatient Medications on File Prior to Visit  Medication Sig Dispense Refill  . atorvastatin (LIPITOR) 10 MG tablet TAKE 1 TABLET BY MOUTH EVERY DAY 90 tablet 1  . Cyanocobalamin (VITAMIN B-12 PO) Take 2 tablets by mouth daily.     Marland Kitchen ibuprofen (ADVIL,MOTRIN) 800 MG tablet Take 1 tablet (800 mg total) by mouth 3 (three) times daily. 21 tablet 0  . JARDIANCE 10 MG TABS tablet TAKE 1 TABLET BY MOUTH EVERY DAY  90 tablet 0  . Multiple Vitamin (MULTIVITAMIN WITH MINERALS) TABS Take 1 tablet by mouth daily.    Marland Kitchen OVER THE COUNTER MEDICATION Take 1 capsule by mouth daily. NUGENIX     . Vitamin D, Ergocalciferol, (DRISDOL) 1.25 MG (50000 UT) CAPS capsule Take 1 capsule (50,000 Units total) by mouth every 7 (seven) days. 8 capsule 0  . saxagliptin HCl (ONGLYZA) 2.5 MG TABS tablet Take 1 tablet (2.5 mg total) by mouth daily. (Patient not taking: Reported on 07/17/2019) 30 tablet 3   No current facility-administered medications on file prior to visit.    BP 122/71 (BP Location: Left Arm, Patient Position: Sitting, Cuff Size: Large)   Pulse 73   Temp 97.8 F (36.6 C) (Temporal)   Resp 16   Ht 5\' 9"  (1.753 m)   Wt 203 lb 12.8 oz (92.4 kg)   SpO2 99%   BMI 30.10 kg/m       Objective:   Physical  Exam  General- No acute distress. Pleasant patient. Neck- Full range of motion, no jvd Lungs- Clear, even and unlabored. Heart- regular rate and rhythm. Neurologic- CNII- XII grossly intact. Rt si area tenderness. When twists has pain bottom gluteal area and in region of rt anterior superior iliac spine      Assessment & Plan:  You have rt sciatic region pain with some nerve pain toward gluteus area. Also some pain in hip area which is likely muscular.  I will rx diclofenac for pain and inflamation. Will rx flexeril low dose.  Gradually ease into activity again as described.  If pain persists or linger consider sports med referral or imaging. Not indicated presently.  Follow up in 7-10 days or as needed  9-10, PA-C   Time spent with patient today was 20  minutes which consisted of chart review, discussing diagnosis,  treatment and documentation.

## 2019-07-28 ENCOUNTER — Telehealth: Payer: Self-pay | Admitting: Medical

## 2019-07-28 NOTE — Telephone Encounter (Signed)
Caller [name]  Call Back # [phone]   Patient states seen on 07/17/2019, insurance does not cover diabetic medication prescribed.Patient states [facility] sent in another medication request to provider for an alternative diabetic medication. Patient hasn't heard anything back in regards to approval of new medication. Patient states that he needs something sent to pharmacy today please.    Patient states he is not aware of medication that insurance will cover, [facility] told him the name but forgot.

## 2019-07-28 NOTE — Telephone Encounter (Signed)
On med review looks like he is on jardiance andfg onglyza. So which medication is not being covered. Let me know. If we have suggestion from pharmacy let me know.

## 2019-07-29 ENCOUNTER — Other Ambulatory Visit: Payer: Self-pay | Admitting: Medical

## 2019-07-29 MED ORDER — LINAGLIPTIN 5 MG PO TABS: 5.0000 mg | ORAL_TABLET | Freq: Every day | ORAL | 3 refills | Status: DC

## 2019-07-29 NOTE — Telephone Encounter (Signed)
The medication that is not covered is onglyza

## 2019-07-29 NOTE — Telephone Encounter (Signed)
Rx [place]. Tradjenta not covered.

## 2019-07-29 NOTE — Telephone Encounter (Signed)
tradjenta rx sent to pt pharmacy in place of onglyza

## 2019-07-29 NOTE — Telephone Encounter (Signed)
Called pt but was unable to lvm. 

## 2019-07-29 NOTE — Telephone Encounter (Signed)
Pt.notified

## 2019-08-04 ENCOUNTER — Other Ambulatory Visit: Payer: Self-pay | Admitting: Medical

## 2019-09-15 ENCOUNTER — Other Ambulatory Visit: Payer: Self-pay

## 2019-09-15 ENCOUNTER — Telehealth: Payer: Self-pay | Admitting: Medical

## 2019-09-15 ENCOUNTER — Ambulatory Visit (INDEPENDENT_AMBULATORY_CARE_PROVIDER_SITE_OTHER): Payer: BC Managed Care – PPO | Admitting: Medical

## 2019-09-15 VITALS — BP 112/62 | HR 74 | Temp 98.6°F | Resp 18 | Ht 69.0 in | Wt 200.4 lb

## 2019-09-15 DIAGNOSIS — R1032 Left lower quadrant pain: Secondary | ICD-10-CM | POA: Diagnosis not present

## 2019-09-15 MED ORDER — DICLOFENAC SODIUM 75 MG PO TBEC
75.0000 mg | DELAYED_RELEASE_TABLET | Freq: Two times a day (BID) | ORAL | 0 refills | Status: DC
Start: 2019-09-15 — End: 2021-04-11

## 2019-09-15 NOTE — Telephone Encounter (Signed)
Will you get pt in with sport med. By early next week. I think Dr. Jordan Likes might be on vacation?

## 2019-09-15 NOTE — Patient Instructions (Signed)
For groin pain will rx diclofenac for pain/inflammation. Warm compress twice daily. Try to rest. Work note until Thursday. Refer to sport med.  Follow up in 7-10 days or as needed

## 2019-09-15 NOTE — Progress Notes (Signed)
° °  Subjective:    Patient ID: Ethan Holt, male    DOB: [birth year], 59 y.o.   MRN: [id number]  HPI   Pt in with some left side groin area pain. Pt states recently while running in out field he stepped in hole and when he stepped out of hole he felt some groin pain. The next day his groin area pain was moderate to severe. 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   Pt has missed work due to pain.  States injury on Saturday.  Missed work on Monday and today.    Review of Systems See hpi.    Objective:   Physical Exam   General- No acute distress. Pleasant patient. Lungs- Clear, even and unlabored. Heart- regular rate and rhythm. Neurologic- CNII- XII grossly intact. Lt hip- no pain on palpation. On abduction and adduction groin pain. Groin direct tender.  Genital exam- no obvious hernia but tender high up in inguinal canal.      Assessment & Plan:  For groin pain will rx diclofenac for pain/inflammation. Warm compress twice daily. Try to rest. Work note until Thursday. Refer to sport med.  Follow up in 7-10 days or as needed  [name], PA-C   Time spent with patient today was 20  minutes which consisted of chart review, discussing diagnosis,  Treatment, referral  and documentation.

## 2019-09-16 NOTE — Telephone Encounter (Signed)
I sent request to [facility] as Urgent.

## 2019-09-16 NOTE — Telephone Encounter (Signed)
Thanks,

## 2019-09-28 ENCOUNTER — Ambulatory Visit: Payer: BC Managed Care – PPO | Admitting: Family Medicine

## 2019-09-28 NOTE — Progress Notes (Deleted)
   Subjective:    I'm seeing this patient as a consultation for:  [employer], [employer]. Note will be routed back to referring provider/PCP.  CC: L hip/groin pain  I, Kristilyn Coltrane, LAT, ATC, am serving as scribe for Dr. Clementeen Graham.  HPI: Pt is a 59 y/o male presenting w/ c/o L groin pain after stepping into a hole while running on 09/13/19.  Radiating pain: Low back pain: Aggravating factors: Treatments tried:  Past medical history, Surgical history, Family history, Social history, Allergies, and medications have been entered into the medical record, reviewed. ***  Review of Systems: No new headache, visual changes, nausea, vomiting, diarrhea, constipation, dizziness, abdominal pain, skin rash, fevers, chills, night sweats, weight loss, swollen lymph nodes, body aches, joint swelling, muscle aches, chest pain, shortness of breath, mood changes, visual or auditory hallucinations.   Objective:   There were no vitals filed for this visit. General: Well Developed, well nourished, and in no acute distress.  Neuro/Psych: Alert and oriented x3, extra-ocular muscles intact, able to move all 4 extremities, sensation grossly intact. Skin: Warm and dry, no rashes noted.  Respiratory: Not using accessory muscles, speaking in full sentences, trachea midline.  Cardiovascular: Pulses palpable, no extremity edema. Abdomen: Does not appear distended. MSK: ***  Lab and Radiology Results No results found for this or any previous visit (from the past 72 hour(s)). No results found.  Impression and Recommendations:    Assessment and Plan: 59 y.o. male with ***.  PDMP not reviewed this encounter. No orders of the defined types were placed in this encounter.  No orders of the defined types were placed in this encounter.   Discussed warning signs or symptoms. Please see discharge instructions. Patient expresses understanding.   ***

## 2019-11-01 ENCOUNTER — Other Ambulatory Visit: Payer: Self-pay | Admitting: Medical

## 2019-12-25 ENCOUNTER — Other Ambulatory Visit: Payer: Self-pay | Admitting: Medical

## 2020-01-28 ENCOUNTER — Other Ambulatory Visit: Payer: Self-pay | Admitting: Medical

## 2020-04-04 ENCOUNTER — Ambulatory Visit: Payer: BC Managed Care – PPO | Admitting: Medical

## 2020-06-06 ENCOUNTER — Other Ambulatory Visit: Payer: Self-pay | Admitting: Family Medicine

## 2020-07-01 ENCOUNTER — Other Ambulatory Visit: Payer: Self-pay

## 2020-07-01 MED ORDER — SITAGLIPTIN PHOSPHATE 100 MG PO TABS
100.0000 mg | ORAL_TABLET | Freq: Every day | ORAL | 1 refills | Status: DC
Start: 2020-07-01 — End: 2020-11-29

## 2020-08-05 ENCOUNTER — Other Ambulatory Visit: Payer: Self-pay

## 2020-08-05 ENCOUNTER — Encounter (HOSPITAL_BASED_OUTPATIENT_CLINIC_OR_DEPARTMENT_OTHER): Payer: Self-pay | Admitting: *Deleted

## 2020-08-05 ENCOUNTER — Emergency Department (HOSPITAL_BASED_OUTPATIENT_CLINIC_OR_DEPARTMENT_OTHER)
Admission: EM | Admit: 2020-08-05 | Discharge: 2020-08-05 | Disposition: A | Discharge status: Home / Self Care | Payer: BC Managed Care – PPO | Attending: Emergency Medicine | Admitting: Emergency Medicine

## 2020-08-05 DIAGNOSIS — S0502XA Injury of conjunctiva and corneal abrasion without foreign body, left eye, initial encounter: Secondary | ICD-10-CM

## 2020-08-05 DIAGNOSIS — Z7984 Long term (current) use of oral hypoglycemic drugs: Secondary | ICD-10-CM | POA: Insufficient documentation

## 2020-08-05 DIAGNOSIS — X58XXXA Exposure to other specified factors, initial encounter: Secondary | ICD-10-CM | POA: Insufficient documentation

## 2020-08-05 DIAGNOSIS — E119 Type 2 diabetes mellitus without complications: Secondary | ICD-10-CM | POA: Insufficient documentation

## 2020-08-05 DIAGNOSIS — J45909 Unspecified asthma, uncomplicated: Secondary | ICD-10-CM | POA: Insufficient documentation

## 2020-08-05 MED ORDER — ERYTHROMYCIN 5 MG/GM OP OINT
TOPICAL_OINTMENT | Freq: Once | OPHTHALMIC | Status: AC
  Administered 2020-08-05: 1 via OPHTHALMIC
  Filled 2020-08-05: qty 3.5

## 2020-08-05 MED ORDER — PROPARACAINE HCL 0.5 % OP SOLN
1.0000 [drp] | Freq: Once | OPHTHALMIC | Status: AC
  Administered 2020-08-05: 23:00:00 1 [drp] via OPHTHALMIC
  Filled 2020-08-05: qty 15

## 2020-08-05 MED ORDER — FLUORESCEIN SODIUM 1 MG OP STRP
ORAL_STRIP | OPHTHALMIC | Status: AC
  Filled 2020-08-05: qty 1

## 2020-08-05 NOTE — ED Triage Notes (Signed)
Redness, itching and tearing from his left eye x 2 days.

## 2020-08-05 NOTE — ED Provider Notes (Signed)
[facility] EMERGENCY DEPARTMENT Provider Note   CSN: [id number] Arrival date & time: 08/05/20  2036     History No chief complaint on file.   Ethan Holt is a 60 y.o. male.  The history is provided by the patient.  Ethan Holt is a 60 y.o. male who presents to the Emergency Department complaining of eye problem.  He presents to the ED complaining of left eye soreness, itching for the last 2-3 days.  It waters occasionally.  No change in vision.  No headache.  Unsure if there was a foreign body.  When it first started bothering him he was rubbing it.    Does not wear corrective lenses.  No fever, chest pain, V/D.    Has a hx/o DM.  No additional medical problems.      Past Medical History:  Diagnosis Date   Allergy    spring   Asthma    as child    Diabetes mellitus without complication (HCC)     There are no problems to display for this patient.   History reviewed. No pertinent surgical history.     No family history on file.  Social History   Tobacco Use   Smoking status: Never   Smokeless tobacco: Current  Substance Use Topics   Alcohol use: No   Drug use: No    Home Medications Prior to Admission medications   Medication Sig Start Date End Date Taking? Authorizing Provider  atorvastatin (LIPITOR) 10 MG tablet TAKE 1 TABLET BY MOUTH EVERY DAY 02/18/19  Yes Saguier, Ramon Dredge, PA-C  Cyanocobalamin (VITAMIN B-12 PO) Take 2 tablets by mouth daily.    Yes [provider]  ibuprofen (ADVIL,MOTRIN) 800 MG tablet Take 1 tablet (800 mg total) by mouth 3 (three) times daily. 11/10/13  Yes Tomasita Crumble, MD  Multiple Vitamin (MULTIVITAMIN WITH MINERALS) TABS Take 1 tablet by mouth daily.   Yes [provider]  sitaGLIPtin (JANUVIA) 100 MG tablet Take 1 tablet (100 mg total) by mouth daily. 07/01/20  Yes Saguier, Ramon Dredge, PA-C  Vitamin D, Ergocalciferol, (DRISDOL) 1.25 MG (50000 UT) CAPS capsule Take 1 capsule (50,000 Units total) by mouth every 7  (seven) days. 02/27/18  Yes Saguier, Ramon Dredge, PA-C  cyclobenzaprine (FLEXERIL) 10 MG tablet Take 1 tablet (10 mg total) by mouth at bedtime. 07/17/19   Saguier, Ramon Dredge, PA-C  diclofenac (VOLTAREN) 75 MG EC tablet Take 1 tablet (75 mg total) by mouth 2 (two) times daily. 07/17/19   Saguier, Ramon Dredge, PA-C  diclofenac (VOLTAREN) 75 MG EC tablet Take 1 tablet (75 mg total) by mouth 2 (two) times daily. 09/15/19   Saguier, Ramon Dredge, PA-C  empagliflozin (JARDIANCE) 10 MG TABS tablet Take 1 tablet (10 mg total) by mouth daily. 06/06/20   Saguier, Ramon Dredge, PA-C  OVER THE COUNTER MEDICATION Take 1 capsule by mouth daily. NUGENIX     [provider]  linagliptin (TRADJENTA) 5 MG TABS tablet Take 1 tablet (5 mg total) by mouth daily. 07/29/19 07/29/19  Saguier, Ramon Dredge, PA-C    Allergies    Patient has no known allergies.  Review of Systems   Review of Systems  All other systems reviewed and are negative.  Physical Exam Updated Vital Signs BP (!) 137/97 (BP Location: Right Arm)   Pulse 81   Temp 98.2 F (36.8 C) (Oral)   Resp 14   Ht 5\' 9"  (1.753 m)   Wt 87 kg   SpO2 96%   BMI 28.31 kg/m   Physical  Exam Vitals and nursing note reviewed.  Constitutional:      Appearance: He is well-developed.  HENT:     Head: Normocephalic and atraumatic.  Eyes:     Extraocular Movements: Extraocular movements intact.     Pupils: Pupils are equal, round, and reactive to light.     Comments: Mild left sided conjunctival injection. Right eye pressure of 17, left eye pressure of 16. There was release of pain after application of proparacaine. No significant uptake on fluorescein staining. There is a punctate lesion at the 5 o'clock position relative to the left pupil.  Cardiovascular:     Rate and Rhythm: Normal rate and regular rhythm.  Pulmonary:     Effort: Pulmonary effort is normal. No respiratory distress.  Musculoskeletal:        General: No tenderness.  Skin:    General: Skin is warm and dry.   Neurological:     Mental Status: He is alert and oriented to person, place, and time.  Psychiatric:        Behavior: Behavior normal.    ED Results / Procedures / Treatments   Labs (all labs ordered are listed, but only abnormal results are displayed) Labs Reviewed - No data to display  EKG None  Radiology No results found.  Procedures Procedures   Medications Ordered in ED Medications  proparacaine (ALCAINE) 0.5 % ophthalmic solution 1 drop (has no administration in time range)  fluorescein 1 MG ophthalmic strip (has no administration in time range)  erythromycin ophthalmic ointment (has no administration in time range)    ED Course  I have reviewed the triage vital signs and the nursing notes.  Pertinent labs & imaging results that were available during my care of the patient were reviewed by me and considered in my medical decision making (see chart for details).    MDM Rules/Calculators/A&P                         Patient here for evaluation of two days of eye itching, pain and irritation. He does have left-sided conjunctival injection, vision is preserved. There is a punctate lesion just inferior to the left pupil at the 5 o'clock position. He does have a remote history of trauma to the eye, unclear if this lesion is old or new. There is no significant fluorescein uptake but based on history feel that this is possibly a corneal abrasion. No dendritic lesions concerning for zoster. No reports of foreign body. No foreign body visualize on exam. Will treat with antibiotics for possible corneal abrasion. Discussed ophthalmology follow-up and return precautions.   Final Clinical Impression(s) / ED Diagnoses Final diagnoses:  Abrasion of left cornea, initial encounter    Rx / DC Orders ED Discharge Orders     None        Tilden Fossa, MD 08/05/20 2221

## 2020-08-05 NOTE — Discharge Instructions (Addendum)
Use the erythromycin eye ointment, four times daily to the affected eye. Get rechecked sooner if you develop worsening pain or new concerning symptoms. You may take ibuprofen or Tylenol, available over-the-counter according to label instructions as needed for pain.

## 2020-08-09 DIAGNOSIS — T1502XA Foreign body in cornea, left eye, initial encounter: Secondary | ICD-10-CM | POA: Diagnosis not present

## 2020-11-29 ENCOUNTER — Telehealth: Payer: Self-pay

## 2020-11-29 ENCOUNTER — Other Ambulatory Visit: Payer: Self-pay

## 2020-11-29 ENCOUNTER — Ambulatory Visit (INDEPENDENT_AMBULATORY_CARE_PROVIDER_SITE_OTHER): Payer: BC Managed Care – PPO | Admitting: Medical

## 2020-11-29 ENCOUNTER — Telehealth: Payer: Self-pay | Admitting: Medical

## 2020-11-29 VITALS — BP 137/89 | HR 79 | Temp 98.2°F | Resp 18 | Ht 73.0 in | Wt 175.0 lb

## 2020-11-29 DIAGNOSIS — Z0000 Encounter for general adult medical examination without abnormal findings: Secondary | ICD-10-CM

## 2020-11-29 DIAGNOSIS — Z125 Encounter for screening for malignant neoplasm of prostate: Secondary | ICD-10-CM | POA: Diagnosis not present

## 2020-11-29 DIAGNOSIS — Z113 Encounter for screening for infections with a predominantly sexual mode of transmission: Secondary | ICD-10-CM

## 2020-11-29 DIAGNOSIS — R350 Frequency of micturition: Secondary | ICD-10-CM

## 2020-11-29 DIAGNOSIS — E1165 Type 2 diabetes mellitus with hyperglycemia: Secondary | ICD-10-CM | POA: Diagnosis not present

## 2020-11-29 DIAGNOSIS — E119 Type 2 diabetes mellitus without complications: Secondary | ICD-10-CM | POA: Diagnosis not present

## 2020-11-29 DIAGNOSIS — Z1211 Encounter for screening for malignant neoplasm of colon: Secondary | ICD-10-CM

## 2020-11-29 DIAGNOSIS — R5383 Other fatigue: Secondary | ICD-10-CM

## 2020-11-29 DIAGNOSIS — R634 Abnormal weight loss: Secondary | ICD-10-CM | POA: Diagnosis not present

## 2020-11-29 DIAGNOSIS — Z794 Long term (current) use of insulin: Secondary | ICD-10-CM

## 2020-11-29 LAB — CBC WITH DIFFERENTIAL/PLATELET
Basophils Absolute: 0 10*3/uL (ref 0.0–0.1)
Basophils Relative: 0.8 % (ref 0.0–3.0)
Eosinophils Absolute: 0.1 10*3/uL (ref 0.0–0.7)
Eosinophils Relative: 1.9 % (ref 0.0–5.0)
HCT: 43.5 % (ref 39.0–52.0)
Hemoglobin: 13.7 g/dL (ref 13.0–17.0)
Lymphocytes Relative: 48.6 % — ABNORMAL HIGH (ref 12.0–46.0)
Lymphs Abs: 1.6 10*3/uL (ref 0.7–4.0)
MCHC: 31.4 g/dL (ref 30.0–36.0)
MCV: 73.6 fl — ABNORMAL LOW (ref 78.0–100.0)
Monocytes Absolute: 0.3 10*3/uL (ref 0.1–1.0)
Monocytes Relative: 9.8 % (ref 3.0–12.0)
Neutro Abs: 1.3 10*3/uL — ABNORMAL LOW (ref 1.4–7.7)
Neutrophils Relative %: 38.9 % — ABNORMAL LOW (ref 43.0–77.0)
Platelets: 281 10*3/uL (ref 150.0–400.0)
RBC: 5.91 Mil/uL — ABNORMAL HIGH (ref 4.22–5.81)
RDW: 15.3 % (ref 11.5–15.5)
WBC: 3.3 10*3/uL — ABNORMAL LOW (ref 4.0–10.5)

## 2020-11-29 LAB — HEMOGLOBIN A1C: Hgb A1c MFr Bld: 16.3 % — ABNORMAL HIGH (ref 4.6–6.5)

## 2020-11-29 LAB — COMPREHENSIVE METABOLIC PANEL
ALT: 19 U/L (ref 0–53)
AST: 13 U/L (ref 0–37)
Albumin: 4.2 g/dL (ref 3.5–5.2)
Alkaline Phosphatase: 83 U/L (ref 39–117)
BUN: 14 mg/dL (ref 6–23)
CO2: 28 mEq/L (ref 19–32)
Calcium: 9.7 mg/dL (ref 8.4–10.5)
Chloride: 100 mEq/L (ref 96–112)
Creatinine, Ser: 1.07 mg/dL (ref 0.40–1.50)
GFR: 74.75 mL/min (ref >60.00)
Glucose, Bld: 335 mg/dL — ABNORMAL HIGH (ref 70–99)
Potassium: 4.3 mEq/L (ref 3.5–5.1)
Sodium: 136 mEq/L (ref 135–145)
Total Bilirubin: 0.8 mg/dL (ref 0.2–1.2)
Total Protein: 6.8 g/dL (ref 6.0–8.3)

## 2020-11-29 LAB — LIPID PANEL
Cholesterol: 192 mg/dL (ref 0–200)
HDL: 48.8 mg/dL
LDL Cholesterol: 134 mg/dL — ABNORMAL HIGH (ref 0–99)
NonHDL: 143.45
Total CHOL/HDL Ratio: 4
Triglycerides: 49 mg/dL (ref 0.0–149.0)
VLDL: 9.8 mg/dL (ref 0.0–40.0)

## 2020-11-29 LAB — TSH: TSH: 1.89 u[IU]/mL (ref 0.35–5.50)

## 2020-11-29 LAB — VITAMIN B12: Vitamin B-12: 1024 pg/mL — ABNORMAL HIGH (ref 211–911)

## 2020-11-29 LAB — T4, FREE: Free T4: 1.19 ng/dL (ref 0.60–1.60)

## 2020-11-29 LAB — PSA: PSA: 0.75 ng/mL (ref 0.10–4.00)

## 2020-11-29 MED ORDER — SITAGLIPTIN PHOSPHATE 100 MG PO TABS: 100.0000 mg | ORAL_TABLET | Freq: Every day | ORAL | 1 refills | Status: DC

## 2020-11-29 MED ORDER — ATORVASTATIN CALCIUM 10 MG PO TABS: 10.0000 mg | ORAL_TABLET | Freq: Every day | ORAL | 1 refills | Status: DC

## 2020-11-29 MED ORDER — LANTUS SOLOSTAR 100 UNIT/ML ~~LOC~~ SOPN: 10.0000 [IU] | PEN_INJECTOR | Freq: Every day | SUBCUTANEOUS | 99 refills | Status: DC

## 2020-11-29 NOTE — Telephone Encounter (Signed)
Rx sent 

## 2020-11-29 NOTE — Patient Instructions (Addendum)
For you wellness exam today I have ordered cbc, cmp,  lipid panel, hiv and psa.  Flu vaccine declined.  Recommend exercise and healthy diet.  We will let you know lab results as they come in.  Follow up one month or sooner if needed.  Referral to GI MD placed screening colonoscopy.  For diabetes put in urine microalbumin.   For frequent urination poct ua.(But describe drinking a lot of water)  For weight unclear if intentional as you have been eating better. But need to follow closely. Want you to make purposeful effort to gain weight over next month. Will follow labs to see if abnormality. If can't gain weight or further weight loss then need to start work up for weight loss.   For diabetes follow a1c. Start back on medicine if needed. Do want you to follow up every 3 months for diabetes.  For fatigue tsh, t4, b1, b12 and vit d.  Preventive Care 74-1 Years Old, Male Preventive care refers to lifestyle choices and visits with your health care provider that can promote health and wellness. This includes: A yearly physical exam. This is also called an annual wellness visit. Regular dental and eye exams. Immunizations. Screening for certain conditions. Healthy lifestyle choices, such as: Eating a healthy diet. Getting regular exercise. Not using drugs or products that contain nicotine and tobacco. Limiting alcohol use. What can I expect for my preventive care visit? Physical exam Your health care provider will check your: Height and weight. These may be used to calculate your BMI (body mass index). BMI is a measurement that tells if you are at a healthy weight. Heart rate and blood pressure. Body temperature. Skin for abnormal spots. Counseling Your health care provider may ask you questions about your: Past medical problems. Family's medical history. Alcohol, tobacco, and drug use. Emotional well-being. Home life and relationship well-being. Sexual activity. Diet,  exercise, and sleep habits. Work and work Astronomer. Access to firearms. What immunizations do I need? Vaccines are usually given at various ages, according to a schedule. Your health care provider will recommend vaccines for you based on your age, medical history, and lifestyle or other factors, such as travel or where you work. What tests do I need? Blood tests Lipid and cholesterol levels. These may be checked every 5 years, or more often if you are over 62 years old. Hepatitis C test. Hepatitis B test. Screening Lung cancer screening. You may have this screening every year starting at age 84 if you have a 30-pack-year history of smoking and currently smoke or have quit within the past 15 years. Prostate cancer screening. Recommendations will vary depending on your family history and other risks. Genital exam to check for testicular cancer or hernias. Colorectal cancer screening. All adults should have this screening starting at age 20 and continuing until age 48. Your health care provider may recommend screening at age 59 if you are at increased risk. You will have tests every 1-10 years, depending on your results and the type of screening test. Diabetes screening. This is done by checking your blood sugar (glucose) after you have not eaten for a while (fasting). You may have this done every 1-3 years. STD (sexually transmitted disease) testing, if you are at risk. Follow these instructions at home: Eating and drinking  Eat a diet that includes fresh fruits and vegetables, whole grains, lean protein, and low-fat dairy products. Take vitamin and mineral supplements as recommended by your health care provider. Do not drink  alcohol if your health care provider tells you not to drink. If you drink alcohol: Limit how much you have to 0-2 drinks a day. Be aware of how much alcohol is in your drink. In the [place] one drink equals one 12 oz bottle of beer (355 mL), one 5 oz glass of wine  (148 mL), or one 1 oz glass of hard liquor (44 mL). Lifestyle Take daily care of your teeth and gums. Brush your teeth every morning and night with fluoride toothpaste. Floss one time each day. Stay active. Exercise for at least 30 minutes 5 or more days each week. Do not use any products that contain nicotine or tobacco, such as cigarettes, e-cigarettes, and chewing tobacco. If you need help quitting, ask your health care provider. Do not use drugs. If you are sexually active, practice safe sex. Use a condom or other form of protection to prevent STIs (sexually transmitted infections). If told by your health care provider, take low-dose aspirin daily starting at age 43. Find healthy ways to cope with stress, such as: Meditation, yoga, or listening to music. Journaling. Talking to a trusted person. Spending time with friends and family. Safety Always wear your seat belt while driving or riding in a vehicle. Do not drive: If you have been drinking alcohol. Do not ride with someone who has been drinking. When you are tired or distracted. While texting. Wear a helmet and other protective equipment during sports activities. If you have firearms in your house, make sure you follow all gun safety procedures. What's next? Go to your health care provider once a year for an annual wellness visit. Ask your health care provider how often you should have your eyes and teeth checked. Stay up to date on all vaccines. This information is not intended to replace advice given to you by your health care provider. Make sure you discuss any questions you have with your health care provider. Document Revised: 04/22/2020 Document Reviewed: 02/06/2018 Elsevier Patient Education  2022 [employer].

## 2020-11-29 NOTE — Progress Notes (Signed)
Subjective:    Patient ID: Ethan Holt, male    DOB: 04/23/1960, 60 y.o.   MRN: [id number]  HPI  Pt in for wellness exam. Pt is fasting. 15 months since he has been in.   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 Some recreational softball twice a week.   Pt is diabetic. Last a1c was 7.8. Pt has not been on diabetic medications. Pt has glucometer at home. Sugars have been 115-135.     Pt has lost weight over past 14 months. He does not specifically state he tried to lose weight. But states eating better.   Pt state he had colonoscopy around 60 years old. He [place] [address] States was normal. He has not been called to do repeat.  Pt declines flu vaccine    Review of Systems  Constitutional:  Positive for fatigue. Negative for chills and fever.  HENT:  Negative for congestion, drooling, ear discharge and ear pain.   Respiratory:  Negative for cough, chest tightness, shortness of breath and wheezing.   Cardiovascular:  Negative for chest pain and palpitations.  Gastrointestinal:  Negative for abdominal distention, abdominal pain, constipation, diarrhea and nausea.  Genitourinary:  Negative for dysuria and frequency.  Musculoskeletal:  Negative for back pain, gait problem, myalgias and neck stiffness.  Skin:  Negative for rash.    Past Medical History:  Diagnosis Date   Allergy    spring   Asthma    as child    Diabetes mellitus without complication (HCC)      Social History   Socioeconomic History   Marital status: Married    Spouse name: Not on file   Number of children: Not on file   Years of education: Not on file   Highest education level: Not on file  Occupational History   Not on file  Tobacco Use   Smoking status: Never   Smokeless tobacco: Current  Substance and Sexual Activity   Alcohol use: No   Drug use: No   Sexual activity: Not on file  Other Topics Concern   Not on file  Social History Narrative    Not on file   Social Determinants of Health   Financial Resource Strain: Not on file  Food Insecurity: Not on file  Transportation Needs: Not on file  Physical Activity: Not on file  Stress: Not on file  Social Connections: Not on file  Intimate Partner Violence: Not on file    No past surgical history on file.  No family history on file.  No Known Allergies  Current Outpatient Medications on File Prior to Visit  Medication Sig Dispense Refill   atorvastatin (LIPITOR) 10 MG tablet TAKE 1 TABLET BY MOUTH EVERY DAY 90 tablet 1   Cyanocobalamin (VITAMIN B-12 PO) Take 2 tablets by mouth daily.      cyclobenzaprine (FLEXERIL) 10 MG tablet Take 1 tablet (10 mg total) by mouth at bedtime. 10 tablet 0   diclofenac (VOLTAREN) 75 MG EC tablet Take 1 tablet (75 mg total) by mouth 2 (two) times daily. 20 tablet 0   diclofenac (VOLTAREN) 75 MG EC tablet Take 1 tablet (75 mg total) by mouth 2 (two) times daily. 20 tablet 0   empagliflozin (JARDIANCE) 10 MG TABS tablet Take 1 tablet (10 mg total) by mouth daily. 30 tablet 0   ibuprofen (ADVIL,MOTRIN) 800 MG tablet Take 1 tablet (800 mg total) by mouth 3 (three) times daily. 21 tablet  0   Multiple Vitamin (MULTIVITAMIN WITH MINERALS) TABS Take 1 tablet by mouth daily.     OVER THE COUNTER MEDICATION Take 1 capsule by mouth daily. NUGENIX      sitaGLIPtin (JANUVIA) 100 MG tablet Take 1 tablet (100 mg total) by mouth daily. 90 tablet 1   Vitamin D, Ergocalciferol, (DRISDOL) 1.25 MG (50000 UT) CAPS capsule Take 1 capsule (50,000 Units total) by mouth every 7 (seven) days. 8 capsule 0   [DISCONTINUED] linagliptin (TRADJENTA) 5 MG TABS tablet Take 1 tablet (5 mg total) by mouth daily. 30 tablet 3   No current facility-administered medications on file prior to visit.    BP 137/89   Pulse 79   Temp 98.2 F (36.8 C)   Resp 18   Ht 6\' 1"  (1.854 m)   Wt 175 lb (79.4 kg)   SpO2 99%   BMI 23.09 kg/m       Objective:   Physical  Exam  General Mental Status- Alert. General Appearance- Not in acute distress.   Skin General: Color- Normal Color. Moisture- Normal Moisture.  Neck Carotid Arteries- Normal color. Moisture- Normal Moisture. No carotid bruits. No JVD.  Chest and Lung Exam Auscultation: Breath Sounds:-Normal.  Cardiovascular Auscultation:Rythm- Regular. Murmurs & Other Heart Sounds:Auscultation of the heart reveals- No Murmurs.  Abdomen Inspection:-Inspeection Normal. Palpation/Percussion:Note:No mass. Palpation and Percussion of the abdomen reveal- Non Tender, Non Distended + BS, no rebound or guarding.   Neurologic Cranial Nerve exam:- CN III-XII intact(No nystagmus), symmetric smile. Strength:- 5/5 equal and symmetric strength both upper and lower extremities.    Rectal- normal sphincter tone. Smooth symmetric prostate normal size.     Assessment & Plan:  For you wellness exam today I have ordered cbc, cmp,  lipid panel, hiv and psa.  Flu vaccine declined.  Recommend exercise and healthy diet.  We will let you know lab results as they come in.  Follow up one month or sooner if needed.  Referral to GI MD placed screening colonoscopy.  For diabetes put in urine microalbumin.   For frequent urination poct ua.(But describe drinking a lot of water)  For weight unclear if intentional as you have been eating better. But need to follow closely. Want you to make purposeful effort to gain weight over next month. Will follow labs to see if abnormality. If can't gain weight or further weight loss then need to start work up for weight loss.   For diabetes follow a1c. Start back on medicine if needed. Do want you to follow up every 3 months for diabetes.  For fatigue tsh, t4, b1, b12 and vit d.  , [name]    [place] as did address diabetes(after hours a1c was 16.3), counseled on how will proceed with weight loss significant with minimal effort and fatigue evaluation.

## 2020-11-29 NOTE — Telephone Encounter (Signed)
Medication: sitaGLIPtin (JANUVIA) 100 MG  atorvastatin (LIPITOR) 10 MG tablet  Has the patient contacted their pharmacy? Yes.   (If no, request that the patient contact the pharmacy for the refill.) (If yes, when and what did the pharmacy advise?)  Preferred Pharmacy (with phone number or street name): [id number] - [place], [place] - [address]  [address] [place] [address]  Phone:  [phone]  Fax:  [phone]  DEA #:  [id number]  Agent: Please be advised that RX refills may take up to 3 business days. We ask that you follow-up with your pharmacy.

## 2020-11-29 NOTE — Telephone Encounter (Signed)
Spoke with patient and he states he will return this evening to provide a specimen for MALB

## 2020-11-30 ENCOUNTER — Telehealth: Payer: Self-pay

## 2020-11-30 LAB — POC URINALSYSI DIPSTICK (AUTOMATED)
Bilirubin, UA: NEGATIVE
Blood, UA: NEGATIVE
Glucose, UA: NEGATIVE
Ketones, UA: NEGATIVE
Leukocytes, UA: NEGATIVE
Nitrite, UA: NEGATIVE
Protein, UA: NEGATIVE
Spec Grav, UA: 1.015 (ref 1.010–1.025)
Urobilinogen, UA: 0.2 E.U./dL
pH, UA: 6 (ref 5.0–8.0)

## 2020-11-30 LAB — MICROALBUMIN / CREATININE URINE RATIO
Creatinine,U: 49.6 mg/dL
Microalb Creat Ratio: 1.4 mg/g (ref 0.0–30.0)
Microalb, Ur: <0.7 mg/dL (ref 0.0–1.9)

## 2020-11-30 NOTE — Telephone Encounter (Signed)
Pt called in wanted to know how to sign in his mychart. I told him that is user name is in all capital letters.

## 2020-11-30 NOTE — Addendum Note (Signed)
Addended by: Mervin Kung A on: 11/30/2020 08:17 AM   Modules accepted: Orders

## 2020-12-02 LAB — VITAMIN D 1,25 DIHYDROXY
Vitamin D 1, 25 (OH)2 Total: 65 pg/mL (ref 18–72)
Vitamin D2 1, 25 (OH)2: <8 pg/mL
Vitamin D3 1, 25 (OH)2: 65 pg/mL

## 2020-12-02 LAB — HIV ANTIBODY (ROUTINE TESTING W REFLEX): HIV 1&2 Ab, 4th Generation: NONREACTIVE

## 2020-12-03 LAB — VITAMIN B1: Vitamin B1 (Thiamine): 9 nmol/L (ref 8–30)

## 2021-01-05 ENCOUNTER — Other Ambulatory Visit: Payer: Self-pay

## 2021-01-06 ENCOUNTER — Ambulatory Visit (INDEPENDENT_AMBULATORY_CARE_PROVIDER_SITE_OTHER): Payer: BC Managed Care – PPO | Admitting: Medical

## 2021-01-06 ENCOUNTER — Encounter: Payer: Self-pay | Admitting: Medical

## 2021-01-06 VITALS — BP 110/68 | HR 79 | Ht 69.0 in | Wt 186.0 lb

## 2021-01-06 DIAGNOSIS — E785 Hyperlipidemia, unspecified: Secondary | ICD-10-CM | POA: Diagnosis not present

## 2021-01-06 DIAGNOSIS — R634 Abnormal weight loss: Secondary | ICD-10-CM

## 2021-01-06 DIAGNOSIS — Z794 Long term (current) use of insulin: Secondary | ICD-10-CM

## 2021-01-06 DIAGNOSIS — E1165 Type 2 diabetes mellitus with hyperglycemia: Secondary | ICD-10-CM | POA: Diagnosis not present

## 2021-01-06 NOTE — Patient Instructions (Addendum)
Your sugars recently in high 160 range. Recommend continue januvia and lantus. Reminder on how to titrate up on lantus based on fasting sugars in morning. When sugars get to 100-110 level stop titrating. If you get sugar less than 100 stop titrating.  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   High cholesterol levels. Continue atorvastatin.   Follow up middle of january or sooner if needed.

## 2021-01-06 NOTE — Progress Notes (Addendum)
Subjective:    Patient ID: [employer], male    DOB: 11-Dec-1960, 60 y.o.   MRN: [id number]  HPI  Her for follow up chronic medical problems.   Hyperlipidemia and patient on atorvastatin.  Diabetic pt and pat last a1c was 16.3. Pt was on januvia 100 mg daily and I had added lantus after seeing his last a1c result.  Pt did get the lantus insulin and using 11-12 units a day. Last sugar check in am fasting was 160. Pt still on the januvia.  Will see diabetic educator in December.   Pt drives a box truck.   Recent unintended weight loss and I had thought weight loss was related to  severe high blood sugars. Pt has gained back some weight since last visit. Was 175 lb on last visit.  Recommended flu vaccine.     Review of Systems  Constitutional:  Negative for chills, fatigue and fever.  HENT:  Negative for congestion and dental problem.   Respiratory:  Negative for cough, chest tightness, shortness of breath and wheezing.   Cardiovascular:  Negative for chest pain and palpitations.  Gastrointestinal:  Negative for abdominal pain.  Endocrine: Negative for polydipsia and polyphagia.  Genitourinary:  Negative for dysuria.  Musculoskeletal:  Negative for back pain.  Skin:  Negative for rash.  Neurological:  Negative for dizziness, seizures, light-headedness and numbness.  Hematological:  Negative for adenopathy. Does not bruise/bleed easily.  Psychiatric/Behavioral:  Negative for behavioral problems, decreased concentration and dysphoric mood.     Past Medical History:  Diagnosis Date   Allergy    spring   Asthma    as child    Diabetes mellitus without complication (HCC)      Social History   Socioeconomic History   Marital status: Married    Spouse name: Not on file   Number of children: Not on file   Years of education: Not on file   Highest education level: Not on file  Occupational History   Not on file  Tobacco Use   Smoking status: Never   Smokeless tobacco:  Current  Substance and Sexual Activity   Alcohol use: No   Drug use: No   Sexual activity: Not on file  Other Topics Concern   Not on file  Social History Narrative   Not on file   Social Determinants of Health   Financial Resource Strain: Not on file  Food Insecurity: Not on file  Transportation Needs: Not on file  Physical Activity: Not on file  Stress: Not on file  Social Connections: Not on file  Intimate Partner Violence: Not on file    No past surgical history on file.  No family history on file.  No Known Allergies  Current Outpatient Medications on File Prior to Visit  Medication Sig Dispense Refill   atorvastatin (LIPITOR) 10 MG tablet Take 1 tablet (10 mg total) by mouth daily. 90 tablet 1   Cyanocobalamin (VITAMIN B-12 PO) Take 2 tablets by mouth daily.      cyclobenzaprine (FLEXERIL) 10 MG tablet Take 1 tablet (10 mg total) by mouth at bedtime. 10 tablet 0   diclofenac (VOLTAREN) 75 MG EC tablet Take 1 tablet (75 mg total) by mouth 2 (two) times daily. 20 tablet 0   diclofenac (VOLTAREN) 75 MG EC tablet Take 1 tablet (75 mg total) by mouth 2 (two) times daily. 20 tablet 0   ibuprofen (ADVIL,MOTRIN) 800 MG tablet Take 1 tablet (800 mg total) by mouth 3 (  three) times daily. 21 tablet 0   insulin glargine (LANTUS SOLOSTAR) 100 UNIT/ML Solostar Pen Inject 10 Units into the skin at bedtime. 15 mL PRN   Multiple Vitamin (MULTIVITAMIN WITH MINERALS) TABS Take 1 tablet by mouth daily.     OVER THE COUNTER MEDICATION Take 1 capsule by mouth daily. NUGENIX      sitaGLIPtin (JANUVIA) 100 MG tablet Take 1 tablet (100 mg total) by mouth daily. 90 tablet 1   Vitamin D, Ergocalciferol, (DRISDOL) 1.25 MG (50000 UT) CAPS capsule Take 1 capsule (50,000 Units total) by mouth every 7 (seven) days. 8 capsule 0   [DISCONTINUED] linagliptin (TRADJENTA) 5 MG TABS tablet Take 1 tablet (5 mg total) by mouth daily. 30 tablet 3   No current facility-administered medications on file prior to  visit.    BP 110/68   Pulse 79   Ht 5\' 9"  (1.753 m)   Wt 186 lb (84.4 kg)   SpO2 98%   BMI 27.47 kg/m            Objective:   Physical Exam  General Mental Status- Alert. General Appearance- Not in acute distress.     Chest and Lung Exam Auscultation: Breath Sounds:-Normal.  Cardiovascular Auscultation:Rythm- Regular. Murmurs & Other Heart Sounds:Auscultation of the heart reveals- No Murmurs.    Neurologic Cranial Nerve exam:- CN III-XII intact(No nystagmus), symmetric smile.       Assessment & Plan:   Your sugars recently in high 160 range. Recommend continue januvia and lantus. Reminder on how to titrate up on lantus based on fasting sugars in morning. When sugars get to 100-110 level stop titrating. If you get sugar less than 100 stop titrating.  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   High cholesterol levels. Continue atorvastatin.   Follow up middle of January or sooner if needed.  February, PA-C    Time spent with patient today was 36  minutes which consisted of chart revdiew, discussing diagnosis, work up treatment and documentation. Discussing how would approach his work restriction on driving box truck until [name] close to 10.

## 2021-01-25 ENCOUNTER — Telehealth: Payer: Self-pay | Admitting: Medical

## 2021-01-25 DIAGNOSIS — Z794 Long term (current) use of insulin: Secondary | ICD-10-CM

## 2021-01-25 NOTE — Telephone Encounter (Signed)
Pt. Stated that he was supposed to get his A1C checked.Don't see orders for labs for this. Please advise.

## 2021-01-25 NOTE — Telephone Encounter (Signed)
Future a1c placed. 

## 2021-01-25 NOTE — Telephone Encounter (Signed)
Ok to drop and schedule a lab appt

## 2021-01-26 NOTE — Telephone Encounter (Signed)
Spoke with Dawn in billing stated insurance may cover future a1c since it was so high , patient scheduled for 12/9

## 2021-01-27 ENCOUNTER — Encounter: Payer: BC Managed Care – PPO | Attending: Medical | Admitting: Dietician

## 2021-02-03 ENCOUNTER — Other Ambulatory Visit: Payer: BC Managed Care – PPO

## 2021-02-10 ENCOUNTER — Other Ambulatory Visit (INDEPENDENT_AMBULATORY_CARE_PROVIDER_SITE_OTHER): Payer: BC Managed Care – PPO

## 2021-02-10 ENCOUNTER — Telehealth: Payer: Self-pay | Admitting: Medical

## 2021-02-10 DIAGNOSIS — Z794 Long term (current) use of insulin: Secondary | ICD-10-CM

## 2021-02-10 DIAGNOSIS — E1165 Type 2 diabetes mellitus with hyperglycemia: Secondary | ICD-10-CM

## 2021-02-10 LAB — HEMOGLOBIN A1C: Hgb A1c MFr Bld: 11.7 % — ABNORMAL HIGH (ref 4.6–6.5)

## 2021-02-10 NOTE — Telephone Encounter (Signed)
Pt called and unable to lvm due to no mailbox

## 2021-02-10 NOTE — Telephone Encounter (Signed)
Pt wants to know some OTC recommendations for muscles spasms or either wants a script sent ,

## 2021-02-10 NOTE — Telephone Encounter (Signed)
Pt would like for [name] to contact him to discuss information - tele: [phone]. Please advise.

## 2021-02-13 NOTE — Telephone Encounter (Signed)
Pt has called back and he is scheduled for next Thursday to take about his shoulder spasms and his DM.   I have spoken to him about his blood work and is willing to keep a log of his blood sugar check in the next week or so.

## 2021-02-16 ENCOUNTER — Telehealth: Payer: Self-pay | Admitting: Medical

## 2021-02-16 MED ORDER — SITAGLIPTIN PHOSPHATE 100 MG PO TABS: 100.0000 mg | ORAL_TABLET | Freq: Every day | ORAL | 1 refills | Status: DC

## 2021-02-16 NOTE — Telephone Encounter (Signed)
Medication: sitaGLIPtin (JANUVIA) 100 MG tablet   Has the patient contacted their pharmacy? Yes.   (If no, request that the patient contact the pharmacy for the refill.) (If yes, when and what did the pharmacy advise?)  Preferred Pharmacy (with phone number or street name):  [id number] [place] - [address]  [address] [place] [address]  Phone:  [phone]  Fax:  [phone]   Agent: Please be advised that RX refills may take up to 3 business days. We ask that you follow-up with your pharmacy.

## 2021-02-16 NOTE — Telephone Encounter (Signed)
Form placed in provider folder.

## 2021-02-16 NOTE — Telephone Encounter (Signed)
02/13/21 -- 6;20 pm  147 02/14/21 -- 5:40 am  134 02/14/21 8:00 pm       116 02/15/21  5:24 am      124 02/15/21  11:15 pm      118 02/16/21   7:10 am      116

## 2021-02-16 NOTE — Telephone Encounter (Signed)
Pt dropped off sheet with his last Blood sugar levels for provider to verify. Document put at front office tray under providers name. Please advise.

## 2021-02-16 NOTE — Telephone Encounter (Signed)
Rx sent 

## 2021-02-17 NOTE — Telephone Encounter (Signed)
Paper was a half sheet of paper so I just documented the readings , below

## 2021-02-17 NOTE — Telephone Encounter (Signed)
Pt has an appt 12/29

## 2021-02-23 ENCOUNTER — Encounter: Payer: Self-pay | Admitting: Medical

## 2021-02-23 ENCOUNTER — Ambulatory Visit (INDEPENDENT_AMBULATORY_CARE_PROVIDER_SITE_OTHER): Payer: BC Managed Care – PPO | Admitting: Medical

## 2021-02-23 VITALS — BP 124/70 | HR 73 | Temp 98.5°F | Resp 18 | Ht 69.0 in | Wt 196.4 lb

## 2021-02-23 DIAGNOSIS — E1165 Type 2 diabetes mellitus with hyperglycemia: Secondary | ICD-10-CM | POA: Diagnosis not present

## 2021-02-23 DIAGNOSIS — E785 Hyperlipidemia, unspecified: Secondary | ICD-10-CM

## 2021-02-23 DIAGNOSIS — Z0100 Encounter for examination of eyes and vision without abnormal findings: Secondary | ICD-10-CM

## 2021-02-23 DIAGNOSIS — E119 Type 2 diabetes mellitus without complications: Secondary | ICD-10-CM

## 2021-02-23 DIAGNOSIS — Z794 Long term (current) use of insulin: Secondary | ICD-10-CM | POA: Diagnosis not present

## 2021-02-23 NOTE — Patient Instructions (Addendum)
Your blood sugar average is much better compared to in October. Continue lantus, januvia and low sugar diet. We did a1c earlier than what is recommend recently per your request. You have appt with dot examiner in 3 weeks. I will put in another future order a1c to do 2 days before that appointment. Please get scheduled for that a1c.  With recent very good sugar levels hopefully your a1c will be lower than 10. Goal in future would be 6.5-7.0 a1c.  For high cholesterol continue atorvastatin.  Glad to see your weight is basically back to normal.  Follow up in 3 months or sooner if needed.

## 2021-02-23 NOTE — Progress Notes (Signed)
Subjective:    Patient ID: [employer], male    DOB: Jul 14, 1960, 60 y.o.   MRN: [id number]  HPI Pt is diabetic. His last a1c was 11.7. prior to that his a1c was 16.3.   He wanted a1c done earlier than 3 month for work purposes.   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  Pt is on lantus 10 units and januvia 100 mg daily.  02/13/21 -- 6;20 pm  147 02/14/21 -- 5:40 am  134 02/14/21 8:00 pm       116 02/15/21  5:24 am      124 02/15/21  11:15 pm      118 02/16/21   7:10 am      116  Pt has DOT exam coming up. He talked with Dot examiner and they told him he needs to have a1c less than 10.  Pt has gained his wt back. Coincided with treating his diabetes.   I had referred to diabetic education. He could not make that appointment.  High cholesterol. On statin.    Review of Systems  Constitutional:  Negative for chills, fatigue and fever.  Respiratory:  Negative for cough and chest tightness.   Cardiovascular:  Negative for chest pain and palpitations.  Gastrointestinal:  Negative for abdominal pain, constipation and nausea.  Genitourinary:  Negative for flank pain.  Musculoskeletal:  Negative for back pain, myalgias and neck stiffness.  Skin:  Negative for rash.  Neurological:  Negative for dizziness, numbness and headaches.  Hematological:  Negative for adenopathy. Does not bruise/bleed easily.  Psychiatric/Behavioral:  Negative for behavioral problems, confusion and sleep disturbance. The patient is not nervous/anxious.    Past Medical History:  Diagnosis Date   Allergy    spring   Asthma    as child    Diabetes mellitus without complication (HCC)      Social History   Socioeconomic History   Marital status: Married    Spouse name: Not on file   Number of children: Not on file   Years of education: Not on file   Highest education level: Not on file  Occupational History   Not on file  Tobacco Use   Smoking status: Never   Smokeless  tobacco: Current  Substance and Sexual Activity   Alcohol use: No   Drug use: No   Sexual activity: Not on file  Other Topics Concern   Not on file  Social History Narrative   Not on file   Social Determinants of Health   Financial Resource Strain: Not on file  Food Insecurity: Not on file  Transportation Needs: Not on file  Physical Activity: Not on file  Stress: Not on file  Social Connections: Not on file  Intimate Partner Violence: Not on file    No past surgical history on file.  No family history on file.  No Known Allergies  Current Outpatient Medications on File Prior to Visit  Medication Sig Dispense Refill   atorvastatin (LIPITOR) 10 MG tablet Take 1 tablet (10 mg total) by mouth daily. 90 tablet 1   Cyanocobalamin (VITAMIN B-12 PO) Take 2 tablets by mouth daily.      cyclobenzaprine (FLEXERIL) 10 MG tablet Take 1 tablet (10 mg total) by mouth at bedtime. 10 tablet 0   diclofenac (VOLTAREN) 75 MG EC tablet Take 1 tablet (75 mg total) by mouth 2 (two) times daily. 20 tablet 0   diclofenac (VOLTAREN) 75 MG EC tablet Take 1  tablet (75 mg total) by mouth 2 (two) times daily. 20 tablet 0   ibuprofen (ADVIL,MOTRIN) 800 MG tablet Take 1 tablet (800 mg total) by mouth 3 (three) times daily. 21 tablet 0   insulin glargine (LANTUS SOLOSTAR) 100 UNIT/ML Solostar Pen Inject 10 Units into the skin at bedtime. 15 mL PRN   Multiple Vitamin (MULTIVITAMIN WITH MINERALS) TABS Take 1 tablet by mouth daily.     OVER THE COUNTER MEDICATION Take 1 capsule by mouth daily. NUGENIX      sitaGLIPtin (JANUVIA) 100 MG tablet Take 1 tablet (100 mg total) by mouth daily. 90 tablet 1   Vitamin D, Ergocalciferol, (DRISDOL) 1.25 MG (50000 UT) CAPS capsule Take 1 capsule (50,000 Units total) by mouth every 7 (seven) days. 8 capsule 0   [DISCONTINUED] linagliptin (TRADJENTA) 5 MG TABS tablet Take 1 tablet (5 mg total) by mouth daily. 30 tablet 3   No current facility-administered medications on file  prior to visit.    BP 124/70 (BP Location: Left Arm, Patient Position: Sitting, Cuff Size: Normal)    Pulse 73    Temp 98.5 F (36.9 C) (Oral)    Resp 18    Ht 5\' 9"  (1.753 m)    Wt 196 lb 6.4 oz (89.1 kg)    SpO2 97%    BMI 29.00 kg/m        Objective:   Physical Exam  General Mental Status- Alert. General Appearance- Not in acute distress.   Skin General: Color- Normal Color. Moisture- Normal Moisture.  Neck Carotid Arteries- Normal color. Moisture- Normal Moisture. No carotid bruits. No JVD.  Chest and Lung Exam Auscultation: Breath Sounds:-Normal.  Cardiovascular Auscultation:Rythm- Regular. Murmurs & Other Heart Sounds:Auscultation of the heart reveals- No Murmurs.  Abdomen Inspection:-Inspeection Normal. Palpation/Percussion:Note:No mass. Palpation and Percussion of the abdomen reveal- Non Tender, Non Distended + BS, no rebound or guarding.  Neurologic Cranial Nerve exam:- CN III-XII intact(No nystagmus), symmetric smile. Strength:- 5/5 equal and symmetric strength both upper and lower extremities.       Assessment & Plan:   Patient Instructions  Your blood sugar average is much better compared to in October. Continue lantus, januvia and low sugar diet. We did a1c earlier than what is recommend recently per your request. You have appt with dot examiner in 3 weeks. I will put in another future order a1c to do 2 days before that appointment. Please get scheduled for that a1c.  With recent very good sugar levels hopefully your a1c will be lower than 10. Goal in future would be 6.5-7.0 a1c.  For high cholesterol continue atorvastatin.  Glad to see your weight is basically back to normal.  Follow up in 3 months or sooner if needed.    November, PA-C

## 2021-03-07 ENCOUNTER — Other Ambulatory Visit (INDEPENDENT_AMBULATORY_CARE_PROVIDER_SITE_OTHER): Payer: BC Managed Care – PPO

## 2021-03-07 DIAGNOSIS — Z794 Long term (current) use of insulin: Secondary | ICD-10-CM

## 2021-03-07 DIAGNOSIS — E1165 Type 2 diabetes mellitus with hyperglycemia: Secondary | ICD-10-CM

## 2021-03-07 LAB — HEMOGLOBIN A1C: Hgb A1c MFr Bld: 10.4 % — ABNORMAL HIGH (ref 4.6–6.5)

## 2021-03-08 ENCOUNTER — Telehealth: Payer: Self-pay

## 2021-03-08 ENCOUNTER — Telehealth: Payer: Self-pay | Admitting: Medical

## 2021-03-08 MED ORDER — LANTUS SOLOSTAR 100 UNIT/ML ~~LOC~~ SOPN: 10.0000 [IU] | PEN_INJECTOR | Freq: Every day | SUBCUTANEOUS | 99 refills | Status: DC

## 2021-03-08 MED ORDER — SITAGLIPTIN PHOSPHATE 100 MG PO TABS: 100.0000 mg | ORAL_TABLET | Freq: Every day | ORAL | 1 refills | Status: DC

## 2021-03-08 NOTE — Telephone Encounter (Signed)
Papers faxed into front office Placed into [place] bin up front

## 2021-03-08 NOTE — Telephone Encounter (Signed)
Medication refill

## 2021-03-09 ENCOUNTER — Telehealth: Payer: Self-pay | Admitting: Medical

## 2021-03-09 NOTE — Telephone Encounter (Signed)
Patient states he has questions regarding his regimen for diabetes medicine. He would like a call back. Please advice.

## 2021-03-09 NOTE — Telephone Encounter (Signed)
Pt called and regiment discussed

## 2021-03-09 NOTE — Telephone Encounter (Signed)
Pts FMLA forms can be filled out at appointment on 03/17/21

## 2021-03-17 ENCOUNTER — Telehealth: Payer: Self-pay | Admitting: Medical

## 2021-03-17 ENCOUNTER — Ambulatory Visit (INDEPENDENT_AMBULATORY_CARE_PROVIDER_SITE_OTHER): Payer: BC Managed Care – PPO | Admitting: Medical

## 2021-03-17 VITALS — BP 122/78 | HR 73 | Temp 98.2°F | Resp 18 | Ht 69.0 in | Wt 194.8 lb

## 2021-03-17 DIAGNOSIS — E785 Hyperlipidemia, unspecified: Secondary | ICD-10-CM

## 2021-03-17 DIAGNOSIS — M25511 Pain in right shoulder: Secondary | ICD-10-CM

## 2021-03-17 DIAGNOSIS — E1165 Type 2 diabetes mellitus with hyperglycemia: Secondary | ICD-10-CM

## 2021-03-17 DIAGNOSIS — Z794 Long term (current) use of insulin: Secondary | ICD-10-CM | POA: Diagnosis not present

## 2021-03-17 NOTE — Telephone Encounter (Signed)
Letter sent to patients email.

## 2021-03-17 NOTE — Patient Instructions (Signed)
For diabetes continue low sugar diet and current [place] as well as lantus.  Attend diabetic educator appointment.  Endocrinologist appt upcoming in March.  For rt shoulder pain placed referral to sport med.  I am writing lett today to explain that short disability form being filled out and will need some time. Goal would be to fax over by wed next week.  Follow up one month or sooner if needed.

## 2021-03-17 NOTE — Progress Notes (Signed)
Subjective:    Patient ID: Ethan Holt, male    DOB: [birth year], 61 y.o.   MRN: [id number]  HPI  Pt in for follow up.  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 He states a1c level was 10 but now they state must be 9.  Pt is on Januvia 100 mg q day. Also on Lantus 12 units daily.    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 He was told by superivsor "they don't know how they will proceed if he can't get his a1c down.)   Pt has some rt anterior shoulder pain for 2 months. Pain at night more common. Feels stiff in morning. Feels click sometimes on rom.   Review of Systems  Constitutional:  Negative for chills and fatigue.  Respiratory:  Negative for cough, chest tightness, shortness of breath and wheezing.   Cardiovascular:  Negative for chest pain and palpitations.  Gastrointestinal:  Negative for abdominal pain.  Endocrine: Negative for polydipsia, polyphagia and polyuria.  Musculoskeletal:  Negative for back pain.  Neurological:  Negative for dizziness, speech difficulty and numbness.  Hematological:  Negative for adenopathy. Does not bruise/bleed easily.  Psychiatric/Behavioral:  Negative for behavioral problems, decreased concentration and dysphoric mood.     Past Medical History:  Diagnosis Date   Allergy    spring   Asthma    as child    Diabetes mellitus without complication (HCC)      Social History   Socioeconomic History   Marital status: Married    Spouse name: Not on file   Number of children: Not on file   Years of education: Not on file   Highest education level: Not on file  Occupational History   Not on file  Tobacco Use   Smoking status: Never   Smokeless tobacco: Current  Substance and Sexual Activity   Alcohol use: No   Drug use: No   Sexual activity: Not on file  Other Topics Concern   Not on file  Social History Narrative   Not on file   Social  Determinants of Health   Financial Resource Strain: Not on file  Food Insecurity: Not on file  Transportation Needs: Not on file  Physical Activity: Not on file  Stress: Not on file  Social Connections: Not on file  Intimate Partner Violence: Not on file    No past surgical history on file.  No family history on file.  No Known Allergies  Current Outpatient Medications on File Prior to Visit  Medication Sig Dispense Refill   atorvastatin (LIPITOR) 10 MG tablet Take 1 tablet (10 mg total) by mouth daily. 90 tablet 1   Cyanocobalamin (VITAMIN B-12 PO) Take 2 tablets by mouth daily.      cyclobenzaprine (FLEXERIL) 10 MG tablet Take 1 tablet (10 mg total) by mouth at bedtime. 10 tablet 0   diclofenac (VOLTAREN) 75 MG EC tablet Take 1 tablet (75 mg total) by mouth 2 (two) times daily. 20 tablet 0   diclofenac (VOLTAREN) 75 MG EC tablet Take 1 tablet (75 mg total) by mouth 2 (two) times daily. 20 tablet 0   ibuprofen (ADVIL,MOTRIN) 800 MG tablet Take 1 tablet (800 mg total) by mouth 3 (three) times daily. 21 tablet 0   insulin glargine (LANTUS SOLOSTAR) 100 UNIT/ML Solostar Pen Inject 10 Units into the skin at bedtime. 15 mL PRN   Multiple Vitamin (  MULTIVITAMIN WITH MINERALS) TABS Take 1 tablet by mouth daily.     OVER THE COUNTER MEDICATION Take 1 capsule by mouth daily. NUGENIX      sitaGLIPtin (JANUVIA) 100 MG tablet Take 1 tablet (100 mg total) by mouth daily. 90 tablet 1   Vitamin D, Ergocalciferol, (DRISDOL) 1.25 MG (50000 UT) CAPS capsule Take 1 capsule (50,000 Units total) by mouth every 7 (seven) days. 8 capsule 0   [DISCONTINUED] linagliptin (TRADJENTA) 5 MG TABS tablet Take 1 tablet (5 mg total) by mouth daily. 30 tablet 3   No current facility-administered medications on file prior to visit.    BP 122/78    Pulse 73    Temp 98.2 F (36.8 C)    Resp 18    Ht 5\' 9"  (1.753 m)    Wt 194 lb 12.8 oz (88.4 kg)    SpO2 100%    BMI 28.77 kg/m       Objective:   Physical  Exam  General Mental Status- Alert. General Appearance- Not in acute distress.    Chest and Lung Exam Auscultation: Breath Sounds:-Normal.  Cardiovascular Auscultation:Rythm- Regular. Murmurs & Other Heart Sounds:Auscultation of the heart reveals- No Murmurs.   Neurologic Cranial Nerve exam:- CN III-XII intact(No nystagmus), symmetric smile. Strength:- 5/5 equal and symmetric strength both upper and lower extremities.   Rt shoulder- mild pain on rom anterior aspect. Also on palpation.    Assessment & Plan:   Patient Instructions  For diabetes continue low sugar diet and current januvia as well as lantus.  Attend diabetic educator appointment.  Endocrinologist appt upcoming in March.  For rt shoulder pain placed referral to sport [facility].  I am writing lett today to explain that short disability form being filled out and will need some time. Goal would be to fax over by wed next week.  Follow up one month or sooner if needed.  April, PA-C

## 2021-03-17 NOTE — Telephone Encounter (Signed)
Pt would like a Dr.'s note excusing him from work,  sent to his email for today's appointment, as he does not have access to [employer]. Please advise.

## 2021-03-21 ENCOUNTER — Telehealth: Payer: Self-pay | Admitting: Medical

## 2021-03-21 NOTE — Telephone Encounter (Signed)
I filled out pt [place] form. Will you fax it on 03-22-2021. Placing it on your desk. Then file for future reference if needed.

## 2021-03-21 NOTE — Telephone Encounter (Signed)
PCP still working on forms when forms done will fax over

## 2021-03-21 NOTE — Telephone Encounter (Signed)
Pt stated forms have been faxed over for extended disability leave, and he will need those filled out as soon as possible. Please advise.

## 2021-03-23 NOTE — Telephone Encounter (Signed)
Form faxed

## 2021-03-27 ENCOUNTER — Encounter: Payer: Self-pay | Admitting: Registered"

## 2021-03-27 ENCOUNTER — Other Ambulatory Visit: Payer: Self-pay

## 2021-03-27 ENCOUNTER — Encounter: Payer: BC Managed Care – PPO | Attending: Medical | Admitting: Registered"

## 2021-03-27 DIAGNOSIS — E119 Type 2 diabetes mellitus without complications: Secondary | ICD-10-CM | POA: Diagnosis not present

## 2021-03-27 NOTE — Progress Notes (Signed)
Diabetes Self-Management Education  Visit Type: First/Initial  Appt. Start Time: 1450 Appt. End Time: 1555  03/27/2021  [name], identified by name and date of birth, is a 61 y.o. male with a diagnosis of Diabetes: Type 2.   ASSESSMENT  There were no vitals taken for this visit. There is no height or weight on file to calculate BMI.  A1c 10.4% Medication Basaglar 10-13 u qhs; Januvia 100 mg with breakfast  Pt states he uses 10 units, sometimes increase it to 13 units  SMBG: FBS: 95-130, when he gets home 3:30 - 5:30 118-136 mg/dL, and before bed (40-981) and based on afternoon and bedtime numbers if over 115 may take one more unit of insulin (10-13 u)  Hypoglycemia: 2 months ago 53 mg/dL without symptoms, drank orange juice.   Patient states he wants a cure for diabetes because that is the only health issue he has. 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  Pt states he doesn't drink milk, doesn't like it. Pt reports he doesn't eat much bread, doesn't really like it and states he will get 2 piece chicken from [employer], unsweet tea the sugar substitute, no fries.  Pt states he has always been active, currently plays senior softball and runs. Pr reports he cuts back on exercise in the winter because he sweats and doesn't want to lower his immunity.  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   Diabetes Self-Management Education - 03/27/21 1444       Visit Information   Visit Type First/Initial      Initial Visit   Diabetes Type Type 2    Are you currently following a meal plan? Yes    What type of meal plan do you follow? seldom eats bread, eats vegetables    Are you taking your medications as prescribed? Yes    Date Diagnosed a year ago      Health Coping   How would you rate your overall health? Good      Psychosocial Assessment   Patient  Belief/Attitude about Diabetes Other (comment)   hate it   How often do you need to have someone help you when you read instructions, pamphlets, or other written materials from your doctor or pharmacy? 3 - Sometimes    What is the last grade level you completed in school? 12th      Complications   Last HgB A1C per patient/outside source 10.4 %    How often do you check your blood sugar? 1-2 times/day    Fasting Blood glucose range (mg/dL) 19-147    Have you had a dilated eye exam in the past 12 months? Yes    Have you had a dental exam in the past 12 months? No   last time was to get tooth pulled 2 1/2 hears ago   Are you checking your feet? Yes    How many days per week are you checking your feet? 6      Dietary Intake   Breakfast none or lance peanut butter crackers OR cruchy cheetos, pepsi zero    Snack (morning) none    Lunch 2 piece chicken, unsweet tea with splenda, green beans    Dinner seafood, green beans, a little bit of brown rice,    Snack (evening) sometimes OR small bag of chips & water with  flavor pack    Beverage(s) 2-5 bottles, pepsi zero, water with flavor pack, coffee with equal am,      Exercise   Exercise Type Light (walking / raking leaves)    How many days per week to you exercise? 3    How many minutes per day do you exercise? 25    Total minutes per week of exercise 75      Patient Education   Previous Diabetes Education No    Nutrition management  Role of diet in the treatment of diabetes and the relationship between the three main macronutrients and blood glucose level    Physical activity and exercise  Role of exercise on diabetes management, blood pressure control and cardiac health.    Monitoring Identified appropriate SMBG and/or A1C goals.    Chronic complications Dental care    Psychosocial adjustment Role of stress on diabetes      Individualized Goals (developed by patient)   Nutrition General guidelines for healthy choices and portions discussed     Physical Activity Exercise 3-5 times per week    Monitoring  test my blood glucose as discussed      Outcomes   Expected Outcomes Demonstrated interest in learning. Expect positive outcomes    Future DMSE 2 months    Program Status Not Completed             Individualized Plan for Diabetes Self-Management Training:   Learning Objective:  Patient will have a greater understanding of diabetes self-management. Patient education plan is to attend individual and/or group sessions per assessed needs and concerns.   Patient Instructions  Try to make your meals balanced  Continue checking your blood sugar: Fasting before eating 90-130 mg/dL Check blood sugar 2 hours after meals 3x/week with a goal of less than 180.  Continue with your changes of eating less bread and choosing unsweetened beverages.  Schedule a dental exam, try to have one at least 1x/year  Expected Outcomes:  Demonstrated interest in learning. Expect positive outcomes  Education material provided: A1C conversion sheet, Snack sheet, and Carbohydrate counting sheet  If problems or questions, patient to contact team via:  Phone and MyChart  Future DSME appointment: 2 months

## 2021-03-27 NOTE — Patient Instructions (Signed)
Try to make your meals balanced  Continue checking your blood sugar: Fasting before eating 90-130 mg/dL Check blood sugar 2 hours after meals 3x/week with a goal of less than 180.  Continue with your changes of eating less bread and choosing unsweetened beverages.  Schedule a dental exam, try to have one at least 1x/year

## 2021-03-28 ENCOUNTER — Ambulatory Visit: Payer: BC Managed Care – PPO | Admitting: Internal Medicine

## 2021-03-28 ENCOUNTER — Encounter: Payer: Self-pay | Admitting: Internal Medicine

## 2021-03-28 NOTE — Progress Notes (Deleted)
Name: Ethan Holt  MRN/ DOB: [id number], 1961-02-02   Age/ Sex: 61 y.o., male    PCP: Marisue Brooklyn   Reason for Endocrinology Evaluation: Type 2 Diabetes Mellitus     Date of Initial Endocrinology Visit: 03/28/2021     PATIENT IDENTIFIER: Ethan Holt is a 61 y.o. male with a past medical history of T2DM, dyslipidemia and Asthma . The patient presented for initial endocrinology clinic visit on 03/28/2021 for consultative assistance with his diabetes management.    HPI: Ethan Holt was    Diagnosed with DM 2020 Prior Medications tried/Intolerance: *** Currently checking blood sugars *** x / day,  before breakfast and ***.  Hypoglycemia episodes : ***               Symptoms: ***                 Frequency: ***/  Hemoglobin A1c has ranged from 6.9% in 08/2018, peaking at 14.9% in 02/2018. Patient required assistance for hypoglycemia:  Patient has required hospitalization within the last 1 year from hyper or hypoglycemia:   In terms of diet, the patient ***   HOME DIABETES REGIMEN: Januvia 100 mg daily  Lantus    Statin: yes ACE-I/ARB: no Prior Diabetic Education: {Yes/No:11203}   METER DOWNLOAD SUMMARY: Date range evaluated: *** Fingerstick Blood Glucose Tests = *** Average Number Tests/Day = *** Overall Mean FS Glucose = *** Standard Deviation = ***  BG Ranges: Low = *** High = ***   Hypoglycemic Events/30 Days: BG < 50 = *** Episodes of symptomatic severe hypoglycemia = ***   DIABETIC COMPLICATIONS: Microvascular complications:  *** Denies: CKD Last eye exam: Completed   Macrovascular complications:   Denies: CAD, PVD, CVA   PAST HISTORY: Past Medical History:  Past Medical History:  Diagnosis Date   Allergy    spring   Asthma    as child    Diabetes mellitus without complication (HCC)    Past Surgical History: No past surgical history on file.  Social History:  reports that he has never smoked. He uses smokeless tobacco. He reports  that he does not drink alcohol and does not use drugs. Family History: No family history on file.   HOME MEDICATIONS: Allergies as of 03/28/2021   No Known Allergies      Medication List        Accurate as of March 28, 2021  8:56 AM. If you have any questions, ask your nurse or doctor.          atorvastatin 10 MG tablet Commonly known as: LIPITOR Take 1 tablet (10 mg total) by mouth daily.   cyclobenzaprine 10 MG tablet Commonly known as: FLEXERIL Take 1 tablet (10 mg total) by mouth at bedtime.   diclofenac 75 MG EC tablet Commonly known as: VOLTAREN Take 1 tablet (75 mg total) by mouth 2 (two) times daily.   diclofenac 75 MG EC tablet Commonly known as: VOLTAREN Take 1 tablet (75 mg total) by mouth 2 (two) times daily.   ibuprofen 800 MG tablet Commonly known as: ADVIL Take 1 tablet (800 mg total) by mouth 3 (three) times daily.   Basaglar KwikPen 100 UNIT/ML Inject 10 Units into the skin daily.   Lantus SoloStar 100 UNIT/ML Solostar Pen Generic drug: insulin glargine Inject 10 Units into the skin at bedtime.   multivitamin with minerals Tabs tablet Take 1 tablet by mouth daily.   OVER THE COUNTER MEDICATION Take 1 capsule by mouth daily.  NUGENIX   sitaGLIPtin 100 MG tablet Commonly known as: Januvia Take 1 tablet (100 mg total) by mouth daily.   VITAMIN B-12 PO Take 2 tablets by mouth daily.   Vitamin D (Ergocalciferol) 1.25 MG (50000 UNIT) Caps capsule Commonly known as: DRISDOL Take 1 capsule (50,000 Units total) by mouth every 7 (seven) days.   VITAMIN D PO Take by mouth.   ZINC PO Take by mouth.         ALLERGIES: No Known Allergies   REVIEW OF SYSTEMS: A comprehensive ROS was conducted with the patient and is negative except as per HPI and below:  ROS    OBJECTIVE:   VITAL SIGNS: There were no vitals taken for this visit.   PHYSICAL EXAM:  General: Pt appears well and is in NAD  Neck: General: Supple without adenopathy  or carotid bruits. Thyroid: Thyroid size normal.  No goiter or nodules appreciated.  Lungs: Clear with good BS bilat with no rales, rhonchi, or wheezes  Heart: RRR with normal S1 and S2 and no gallops; no murmurs; no rub  Abdomen: Normoactive bowel sounds, soft, nontender, without masses or organomegaly palpable  Extremities:  Lower extremities - No pretibial edema. No lesions.  Skin: Normal texture and temperature to palpation. No rash noted. No Acanthosis nigricans/skin tags. No lipohypertrophy.  Neuro: MS is good with appropriate affect, pt is alert and Ox3    DM foot exam:    DATA REVIEWED:  Lab Results  Component Value Date   HGBA1C 10.4 (H) 03/07/2021   HGBA1C 11.7 (H) 02/10/2021   HGBA1C 16.3 Repeated and verified X2. (H) 11/29/2020   Lab Results  Component Value Date   MICROALBUR <0.7 11/29/2020   LDLCALC 134 (H) 11/29/2020   CREATININE 1.07 11/29/2020   Lab Results  Component Value Date   MICRALBCREAT 1.4 11/29/2020    Lab Results  Component Value Date   CHOL 192 11/29/2020   HDL 48.80 11/29/2020   LDLCALC 134 (H) 11/29/2020   TRIG 49.0 11/29/2020   CHOLHDL 4 11/29/2020        ASSESSMENT / PLAN / RECOMMENDATIONS:   1) Type *** Diabetes Mellitus, ***controlled, With*** complications - Most recent A1c of *** %. Goal A1c < *** %.  ***  Plan: GENERAL: ***  MEDICATIONS: ***  EDUCATION / INSTRUCTIONS: BG monitoring instructions: Patient is instructed to check his blood sugars *** times a day, ***. Call [facility] if: BG persistently < 70 or > 300. I reviewed the Rule of 15 for the treatment of hypoglycemia in detail with the patient. Literature supplied.   2) Diabetic complications:  Eye: Does *** have known diabetic retinopathy.  Neuro/ Feet: Does *** have known diabetic peripheral neuropathy. Renal: Patient does *** have known baseline CKD. He is *** on an ACEI/ARB at present.Check urine albumin/creatinine ratio yearly starting at  time of diagnosis. If albuminuria is positive, treatment is geared toward better glucose, blood pressure control and use of ACE inhibitors or ARBs. Monitor electrolytes and creatinine once to twice yearly.   3) Lipids: Patient is *** on a statin.    4) Hypertension: ***  at goal of < 140/90 mmHg.       Signed electronically by: Lyndle Herrlich, MD  [facility] Endocrinology  [facility] [address] [place], [place] [address] Phone: [phone] FAX: [phone]   CC: Marisue Brooklyn [address] [place] [address] Phone: [phone]  Fax: [phone]  Return to Endocrinology clinic as below: Future Appointments  Date Time Provider Department Center  03/28/2021 11:50 AM Alethia Melendrez, Konrad DoloresIbtehal Jaralla, MD [facility]  03/29/2021  1:30 PM Myra RudeSchmitz, Jeremy E, MD [facility]  05/15/2021  4:45 PM Carolan ShiverJohnston, Angela D, RD [facility]

## 2021-03-29 ENCOUNTER — Ambulatory Visit: Payer: Self-pay

## 2021-03-29 ENCOUNTER — Ambulatory Visit (INDEPENDENT_AMBULATORY_CARE_PROVIDER_SITE_OTHER): Payer: BC Managed Care – PPO | Admitting: Family Medicine

## 2021-03-29 ENCOUNTER — Encounter: Payer: Self-pay | Admitting: Family Medicine

## 2021-03-29 VITALS — BP 118/90 | Ht 69.0 in | Wt 194.0 lb

## 2021-03-29 DIAGNOSIS — M24111 Other articular cartilage disorders, right shoulder: Secondary | ICD-10-CM | POA: Diagnosis not present

## 2021-03-29 DIAGNOSIS — M25511 Pain in right shoulder: Secondary | ICD-10-CM

## 2021-03-29 MED ORDER — TRIAMCINOLONE ACETONIDE 40 MG/ML IJ SUSP
40.0000 mg | Freq: Once | INTRAMUSCULAR | Status: AC
  Administered 2021-03-29: 40 mg via INTRA_ARTICULAR

## 2021-03-29 NOTE — Assessment & Plan Note (Signed)
Acutely occurring.  Symptoms likely associated with degenerative labrum given the effusion on exam. -Counseled on home exercise therapy and supportive care. -Injection today. -Could consider physical therapy

## 2021-03-29 NOTE — Progress Notes (Signed)
°  Crespin Forstrom - 61 y.o. male MRN [id number]  Date of birth: [birth year]  SUBJECTIVE:  Including CC & ROS.  No chief complaint on file.   Nichols Corter is a 61 y.o. male that is presenting with acute on chronic right shoulder pain.  The pain is occurring over the anterior aspect of the shoulder.  Denies any injury or inciting event.  No improvement with modalities today.    Review of Systems See HPI   HISTORY: Past Medical, Surgical, Social, and Family History Reviewed & Updated per EMR.   Pertinent Historical Findings include:  Past Medical History:  Diagnosis Date   Allergy    spring   Asthma    as child    Diabetes mellitus without complication (HCC)     History reviewed. No pertinent surgical history.   PHYSICAL EXAM:  VS: BP 118/90 (BP Location: Left Arm, Patient Position: Sitting)    Ht 5\' 9"  (1.753 m)    Wt 194 lb (88 kg)    BMI 28.65 kg/m  Physical Exam Gen: NAD, alert, cooperative with exam, well-appearing MSK:  Neurovascularly intact    Limited ultrasound: Right shoulder:  Effusion noted that the superior portion of the proximal biceps tendon. Effusion noted of the subscapularis on dynamic testing. Mild subacromial bursitis. Effusion of the posterior glenohumeral joint.  Summary: Effusion of the shoulder  Ultrasound and interpretation by , MD   Aspiration/Injection Procedure Note Adair Lauderback 1960-07-01  Procedure: Injection Indications: Right shoulder pain  Procedure Details Consent: Risks of procedure as well as the alternatives and risks of each were explained to the (patient/caregiver).  Consent for procedure obtained. Time Out: Verified patient identification, verified procedure, site/side was marked, verified correct patient position, special equipment/implants available, medications/allergies/relevent history reviewed, required imaging and test results available.  Performed.  The area was cleaned with iodine and alcohol swabs.    The  right glenohumeral joint was injected using 3 cc of 1% lidocaine on a 22-gauge 3-1/2 inch needle.  The syringe was switched to mixture containing 1 cc's of 40 mg's Kenalog and 4 cc's of 0.25% bupivacaine was injected.  Ultrasound was used. Images were obtained in short views showing the injection.     A sterile dressing was applied.  Patient did tolerate procedure well.    ASSESSMENT & PLAN:   Labral tear of shoulder, degenerative, right Acutely occurring.  Symptoms likely associated with degenerative labrum given the effusion on exam. -Counseled on home exercise therapy and supportive care. -Injection today. -Could consider physical therapy

## 2021-03-29 NOTE — Patient Instructions (Signed)
Nice to meet you Please try ice  Please try the exercises   Please send me a message in MyChart with any questions or updates.  Please see me back in 4-6 weeks.   --Dr. Titania Gault  

## 2021-04-11 ENCOUNTER — Ambulatory Visit: Payer: BC Managed Care – PPO | Admitting: Medical

## 2021-04-11 ENCOUNTER — Ambulatory Visit (INDEPENDENT_AMBULATORY_CARE_PROVIDER_SITE_OTHER): Payer: BC Managed Care – PPO | Admitting: Internal Medicine

## 2021-04-11 ENCOUNTER — Encounter: Payer: Self-pay | Admitting: Internal Medicine

## 2021-04-11 VITALS — BP 130/70 | HR 88 | Ht 69.0 in | Wt 190.0 lb

## 2021-04-11 DIAGNOSIS — E1165 Type 2 diabetes mellitus with hyperglycemia: Secondary | ICD-10-CM | POA: Diagnosis not present

## 2021-04-11 DIAGNOSIS — Z794 Long term (current) use of insulin: Secondary | ICD-10-CM | POA: Diagnosis not present

## 2021-04-11 LAB — POCT GLUCOSE (DEVICE FOR HOME USE): Glucose Fasting, POC: 124 mg/dL — AB (ref 70–99)

## 2021-04-11 MED ORDER — BASAGLAR KWIKPEN 100 UNIT/ML ~~LOC~~ SOPN: 10.0000 [IU] | PEN_INJECTOR | Freq: Every day | SUBCUTANEOUS | 3 refills | Status: DC

## 2021-04-11 MED ORDER — EMPAGLIFLOZIN 25 MG PO TABS: 25.0000 mg | ORAL_TABLET | Freq: Every day | ORAL | 2 refills | Status: DC

## 2021-04-11 MED ORDER — INSULIN PEN NEEDLE 32G X 4 MM MISC: 1.0000 | Freq: Every day | 3 refills | Status: AC

## 2021-04-11 MED ORDER — FREESTYLE LIBRE 2 SENSOR MISC: 1.0000 | 3 refills | Status: AC

## 2021-04-11 NOTE — Progress Notes (Signed)
Name: Ethan Holt  MRN/ DOB: [id number], [birth year]   Age/ Sex: 61 y.o., male    PCP: Elise Benne   Reason for Endocrinology Evaluation: Type 2 Diabetes Mellitus     Date of Initial Endocrinology Visit: 04/11/2021     PATIENT IDENTIFIER: Ethan Holt is a 61 y.o. male with a past medical history of T2DM, dyslipidemia and Asthma . The patient presented for initial endocrinology clinic visit on 04/11/2021 for consultative assistance with his diabetes management.    HPI: Ethan Holt was    Diagnosed with DM 2020 Prior Medications tried/Intolerance: Metformin - made him feel funny . Insulin started 01/2022 Currently checking blood sugars 2-4 x / day  Hypoglycemia episodes : yes               Symptoms: yes                 Frequency: last episode was 2-3 months ago  Hemoglobin A1c has ranged from 6.9% in 08/2018, peaking at 14.9% in 02/2018. Patient required assistance for hypoglycemia: no Patient has required hospitalization within the last 1 year from hyper or hypoglycemia: no  In terms of diet, the patient snacks through the day and eats 1 meal day   He is active person running and softball Avoids sugar-sweetened beverages   HOME DIABETES REGIMEN: Januvia 100 mg daily  Basaglar 10-12 units daily    Statin: yes ACE-I/ARB: no Prior Diabetic Education: yes    METER DOWNLOAD SUMMARY: did not bring      DIABETIC COMPLICATIONS: Microvascular complications:   Denies: CKD, neuropathy , retinopathy  Last eye exam: Completed 2022  Macrovascular complications:   Denies: CAD, PVD, CVA   PAST HISTORY: Past Medical History:  Past Medical History:  Diagnosis Date   Allergy    spring   Asthma    as child    Diabetes mellitus without complication ([place])    Past Surgical History: No past surgical history on file.  Social History:  reports that he has never smoked. He uses smokeless tobacco. He reports that he does not drink alcohol and does not use drugs. Family  History: No family history on file.   HOME MEDICATIONS: Allergies as of 04/11/2021   No Known Allergies      Medication List        Accurate as of April 11, 2021  3:10 PM. If you have any questions, ask your nurse or doctor.          STOP taking these medications    cyclobenzaprine 10 MG tablet Commonly known as: FLEXERIL Stopped by: Dorita Sciara, MD   diclofenac 75 MG EC tablet Commonly known as: VOLTAREN Stopped by: Dorita Sciara, MD   Vitamin D (Ergocalciferol) 1.25 MG (50000 UNIT) Caps capsule Commonly known as: DRISDOL Stopped by: Dorita Sciara, MD       TAKE these medications    atorvastatin 10 MG tablet Commonly known as: LIPITOR Take 1 tablet (10 mg total) by mouth daily.   Basaglar KwikPen 100 UNIT/ML Inject 10 Units into the skin daily. What changed: Another medication with the same name was removed. Continue taking this medication, and follow the directions you see here. Changed by: Dorita Sciara, MD   empagliflozin 25 MG Tabs tablet Commonly known as: Jardiance Take 1 tablet (25 mg total) by mouth daily before breakfast. Started by: Dorita Sciara, MD   FreeStyle Libre 2 Sensor Misc 1 Device by Does not apply route every  14 (fourteen) days. Started by: Dorita Sciara, MD   ibuprofen 800 MG tablet Commonly known as: ADVIL Take 1 tablet (800 mg total) by mouth 3 (three) times daily.   multivitamin with minerals Tabs tablet Take 1 tablet by mouth daily.   OVER THE COUNTER MEDICATION Take 1 capsule by mouth daily. NUGENIX   sitaGLIPtin 100 MG tablet Commonly known as: Januvia Take 1 tablet (100 mg total) by mouth daily.   VITAMIN B-12 PO Take 2 tablets by mouth daily.   VITAMIN D PO Take by mouth.   ZINC PO Take by mouth.         ALLERGIES: No Known Allergies   REVIEW OF SYSTEMS: A comprehensive ROS was conducted with the patient and is negative except as per HPI    OBJECTIVE:    VITAL SIGNS: BP 130/70 (BP Location: Left Arm, Patient Position: Sitting, Cuff Size: Small)    Pulse 88    Ht 5\' 9"  (1.753 m)    Wt 190 lb (86.2 kg)    SpO2 96%    BMI 28.06 kg/m    PHYSICAL EXAM:  General: Pt appears well and is in NAD  Neck: General: Supple without adenopathy or carotid bruits. Thyroid: Thyroid size normal.  No goiter or nodules appreciated.  Lungs: Clear with good BS bilat with no rales, rhonchi, or wheezes  Heart: RRR with normal S1 and S2 and no gallops; no murmurs; no rub  Abdomen: Normoactive bowel sounds, soft, nontender, without masses or organomegaly palpable  Extremities:  Lower extremities - No pretibial edema. No lesions.  Neuro: MS is good with appropriate affect, pt is alert and Ox3    DM foot exam: 04/11/2021  The skin of the feet is intact without sores or ulcerations. The pedal pulses are 2+ on right and 2+ on left. The sensation is intact to a screening 5.07, 10 gram monofilament bilaterally   DATA REVIEWED:  Lab Results  Component Value Date   HGBA1C 10.4 (H) 03/07/2021   HGBA1C 11.7 (H) 02/10/2021   HGBA1C 16.3 Repeated and verified X2. (H) 11/29/2020   Lab Results  Component Value Date   MICROALBUR <0.7 11/29/2020   LDLCALC 134 (H) 11/29/2020   CREATININE 1.07 11/29/2020   Lab Results  Component Value Date   MICRALBCREAT 1.4 11/29/2020    Lab Results  Component Value Date   CHOL 192 11/29/2020   HDL 48.80 11/29/2020   LDLCALC 134 (H) 11/29/2020   TRIG 49.0 11/29/2020   CHOLHDL 4 11/29/2020        ASSESSMENT / PLAN / RECOMMENDATIONS:   1) Type 2 Diabetes Mellitus, Poorly controlled, Without complications - Most recent A1c of 10.4 %. Goal A1c < 7.0 %.    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 Currently on short term disability  - We discussed adding Jardiance , he was on it in the past but is not sure why it was changed  - Cautioned against genital infections.  - Will continue [place]  - he was also advised  not to change the dose of basal insulin  - He is already meeting with  RD  - Freestyle libre was prescribed   MEDICATIONS:  Jardiance 25 mg , Take half a tablet every morning for 1 weeks, then increase to 1 tablet daily  - Continue Januvia 100 mg daily  - Take Basaglar 10 units daily   EDUCATION / INSTRUCTIONS: BG monitoring instructions: Patient is instructed to check  his blood sugars 1 times a day, fasting . Call [facility] if: BG persistently < 70  I reviewed the Rule of 15 for the treatment of hypoglycemia in detail with the patient. Literature supplied.   2) Diabetic complications:  Eye: Does not have known diabetic retinopathy.  Neuro/ Feet: Does not have known diabetic peripheral neuropathy. Renal: Patient does not have known baseline CKD. He is not on an ACEI/ARB at present.    F/U in 4 weeks     Signed electronically by: Mack Guise, MD  [facility]  [facility] [address] [place], [place] [address] Phone: [phone] FAX: [phone]   CC: Elise Benne [id number] [place] [address] Phone: [phone]  Fax: [phone]    Return to Endocrinology clinic as below: Future Appointments  Date Time Provider [place]  05/09/2021  1:20 PM Juline Sanderford, Melanie Crazier, MD [facility] None  05/10/2021  2:30 PM Rosemarie Ax, MD [facility]  05/15/2021  4:45 PM Christella Hartigan, RD [place] [facility]

## 2021-04-11 NOTE — Patient Instructions (Signed)
-   Jardiance 25 mg , Take half a tablet every morning for 1 weeks, then increase to 1 tablet daily  - Continue Januvia 100 mg daily  - Take Basaglar 10 units daily      HOW TO TREAT LOW BLOOD SUGARS (Blood sugar LESS THAN 70 MG/DL) Please follow the RULE OF 15 for the treatment of hypoglycemia treatment (when your (blood sugars are less than 70 mg/dL)   STEP 1: Take 15 grams of carbohydrates when your blood sugar is low, which includes:  3-4 GLUCOSE TABS  OR 3-4 OZ OF JUICE OR REGULAR SODA OR ONE TUBE OF GLUCOSE GEL    STEP 2: RECHECK blood sugar in 15 MINUTES STEP 3: If your blood sugar is still low at the 15 minute recheck --> then, go back to STEP 1 and treat AGAIN with another 15 grams of carbohydrates.

## 2021-05-09 ENCOUNTER — Other Ambulatory Visit: Payer: Self-pay

## 2021-05-09 ENCOUNTER — Encounter: Payer: Self-pay | Admitting: Internal Medicine

## 2021-05-09 ENCOUNTER — Ambulatory Visit (INDEPENDENT_AMBULATORY_CARE_PROVIDER_SITE_OTHER): Payer: BC Managed Care – PPO | Admitting: Internal Medicine

## 2021-05-09 VITALS — BP 120/80 | HR 72 | Ht 69.0 in | Wt 190.4 lb

## 2021-05-09 DIAGNOSIS — R739 Hyperglycemia, unspecified: Secondary | ICD-10-CM

## 2021-05-09 LAB — POCT GLYCOSYLATED HEMOGLOBIN (HGB A1C): Hemoglobin A1C: 7.2 % — AB (ref 4.0–5.6)

## 2021-05-09 LAB — POCT GLUCOSE (DEVICE FOR HOME USE): Glucose Fasting, POC: 113 mg/dL — AB (ref 70–99)

## 2021-05-09 NOTE — Progress Notes (Signed)
?Name: Ethan Holt  ?MRN/ DOB: [id number], 1960-04-20   ?Age/ Sex: 61 y.o., male   ? ?PCP: [name], PA-C   ?Reason for Endocrinology Evaluation: Type 2 Diabetes Mellitus  ?   ?Date of Initial Endocrinology Visit: 04/11/2021  ? ? ?PATIENT IDENTIFIER: [name] is a 61 y.o. male with a past medical history of T2DM, dyslipidemia and Asthma . The patient presented for initial endocrinology clinic visit on 04/11/2021 for consultative assistance with his diabetes management.  ? ? ?HPI: ?[name] was  ? ? ?Diagnosed with DM 2020 ?Prior Medications tried/Intolerance: Metformin - made him feel funny . Insulin started 01/2022 ?Hemoglobin A1c has ranged from 6.9% in 08/2018, peaking at 14.9% in 02/2018. ? ? ? ?He is active person running and softball ? ? ?On his initial visit to our clinic he had an A1c of 10.4% , he was on insulin and Januvia, We continued Januvia and insulin  and started Jardiance  ? ?SUBJECTIVE:  ? ?During the last visit (05/09/2021): A1c 10.4%  ?Started jardiance , continued insulin , stopped [place]  ? ? ? ?Today (05/09/21): [name] is here for a follow up on diabetes management. He checks his blood sugars 3 times daily. Denies hypoglycemia since his last visit .  ? ?He denies nausea, vomiting or diarrhea  ? ?HOME DIABETES REGIMEN: ?Januvia 100 mg daily  ?Basaglar 10 units daily  ?Jardiance 25 mg daily  ? ? ? ? ? ?Statin: yes ?ACE-I/ARB: no ?Prior Diabetic Education: yes  ? ? ?METER DOWNLOAD SUMMARY: did not bring  ? ? ? ? ?DIABETIC COMPLICATIONS: ?Microvascular complications:  ? ?Denies: CKD, neuropathy , retinopathy  ?Last eye exam: Completed 2022 ? ?Macrovascular complications:  ? ?Denies: CAD, PVD, CVA ? ? ?PAST HISTORY: ?Past Medical History:  ?Past Medical History:  ?Diagnosis Date  ? Allergy   ? spring  ? Asthma   ? as child   ? Diabetes mellitus without complication ([facility])   ? ?Past Surgical History: No past surgical history on file.  ?Social History:  reports that he has never smoked.  He uses smokeless tobacco. He reports that he does not drink alcohol and does not use drugs. ?Family History: No family history on file. ? ? ?HOME MEDICATIONS: ?Allergies as of 05/09/2021   ?No Known Allergies ?  ? ?  ?Medication List  ?  ? ?  ? Accurate as of May 09, 2021  2:29 PM. If you have any questions, ask your nurse or doctor.  ?  ?  ? ?  ? ?atorvastatin 10 MG tablet ?Commonly known as: LIPITOR ?Take 1 tablet (10 mg total) by mouth daily. ?  ?Basaglar KwikPen 100 UNIT/ML ?Inject 10 Units into the skin daily. ?  ?empagliflozin 25 MG Tabs tablet ?Commonly known as: Jardiance ?Take 1 tablet (25 mg total) by mouth daily before breakfast. ?  ?FreeStyle Libre 2 Sensor Misc ?1 Device by Does not apply route every 14 (fourteen) days. ?  ?ibuprofen 800 MG tablet ?Commonly known as: ADVIL ?Take 1 tablet (800 mg total) by mouth 3 (three) times daily. ?  ?Insulin Pen Needle 32G X 4 MM Misc ?1 Device by Does not apply route daily. ?  ?multivitamin with minerals Tabs tablet ?Take 1 tablet by mouth daily. ?  ?OVER THE COUNTER MEDICATION ?Take 1 capsule by mouth daily. NUGENIX ?  ?sitaGLIPtin 100 MG tablet ?Commonly known as: Januvia ?Take 1 tablet (100 mg total) by mouth daily. ?  ?VITAMIN B-12 PO ?Take 2 tablets  by mouth daily. ?  ?VITAMIN D PO ?Take by mouth. ?  ?ZINC PO ?Take by mouth. ?  ? ?  ? ? ? ?ALLERGIES: ?No Known Allergies ? ? ?REVIEW OF SYSTEMS: ?A comprehensive ROS was conducted with the patient and is negative except as per HPI  ?  ?OBJECTIVE:  ? ?VITAL SIGNS: BP 120/80 (BP Location: Left Arm, Patient Position: Sitting, Cuff Size: Small)   Pulse 72   Ht 5\' 9"  (1.753 m)   Wt 190 lb 6.4 oz (86.4 kg)   SpO2 99%   BMI 28.12 kg/m?   ? ?PHYSICAL EXAM:  ?General: Pt appears well and is in NAD  ?Neck: General: Supple without adenopathy or carotid bruits. ?Thyroid: Thyroid size normal.  No goiter or nodules appreciated.  ?Lungs: Clear with good BS bilat with no rales, rhonchi, or wheezes  ?Heart: RRR with normal  S1 and S2 and no gallops; no murmurs; no rub  ?Abdomen: Normoactive bowel sounds, soft, nontender, without masses or organomegaly palpable  ?Extremities:  ?Lower extremities - No pretibial edema. No lesions.  ?Neuro: MS is good with appropriate affect, pt is alert and Ox3  ? ? ?DM foot exam: 04/11/2021 ? ?The skin of the feet is intact without sores or ulcerations. ?The pedal pulses are 2+ on right and 2+ on left. ?The sensation is intact to a screening 5.07, 10 gram monofilament bilaterally ? ? ?DATA REVIEWED: ? ?Lab Results  ?Component Value Date  ? HGBA1C 7.2 (A) 05/09/2021  ? HGBA1C 10.4 (H) 03/07/2021  ? HGBA1C 11.7 (H) 02/10/2021  ? ?Lab Results  ?Component Value Date  ? MICROALBUR <0.7 11/29/2020  ? LDLCALC 134 (H) 11/29/2020  ? CREATININE 1.07 11/29/2020  ? ?Lab Results  ?Component Value Date  ? MICRALBCREAT 1.4 11/29/2020  ? ? ?Lab Results  ?Component Value Date  ? CHOL 192 11/29/2020  ? HDL 48.80 11/29/2020  ? LDLCALC 134 (H) 11/29/2020  ? TRIG 49.0 11/29/2020  ? CHOLHDL 4 11/29/2020  ?     ? ?ASSESSMENT / PLAN / RECOMMENDATIONS:  ? ?1) Type 2 Diabetes Mellitus, optimally controlled, Without complications - Most recent A1c of 7.2 %. Goal A1c < 7.0 %.   ? ?- Praised the pt on improved glycemic control, A1c down from 10.4% to 7.2 %  ?- Will stop Basal insulin  ?- We discussed switching Januvia to GLP1 agonists in the future  ?- He was encouraged to continue glucose checks at home and to contact me with BG > 150 mg/dL, we may consider glipizide if needed  ? ? ? ?MEDICATIONS: ?Continue  Jardiance 25 mg ,1 tablet daily  ?Continue Januvia 100 mg daily  ?Stop  Basaglar 10 units daily  ? ?EDUCATION / INSTRUCTIONS: ?BG monitoring instructions: Patient is instructed to check his blood sugars 1 times a day, fasting . ?Call [place] [facility] if: BG persistently < 70  ?I reviewed the Rule of 15 for the treatment of hypoglycemia in detail with the patient. Literature supplied. ? ? ?2) Diabetic complications:   ?Eye: Does not have known diabetic retinopathy.  ?Neuro/ Feet: Does not have known diabetic peripheral neuropathy. ?Renal: Patient does not have known baseline CKD. He is not on an ACEI/ARB at present. ? ? ? ?F/U in 4 weeks  ? ? ? ?Signed electronically by: ?Abby 01/29/2021, MD ? ?[facility]  ?[facility] ?[address] ?[place], [place] ?Phone: [phone] ?FAX: [phone]  ? ?CC: ?[phone], PA-C ?[phone] ?[place] [id number] ?Phone: [phone]  ?Fax: [phone] ? ? ? ?Return to Endocrinology clinic as below: ?Future Appointments  ?Date Time Provider Department Center  ?05/10/2021  2:30 PM Myra RudeSchmitz, Jeremy E, MD [facility]  ?05/15/2021  4:45 PM Carolan ShiverJohnston, Angela D, RD [facility]  ?08/15/2021  2:40 PM Rigel Filsinger, Konrad DoloresIbtehal Jaralla, MD [facility] None  ?  ? ?

## 2021-05-09 NOTE — Patient Instructions (Signed)
-   Keep Up the Good Work ! ?- Continue Jardiance 25 mg, 1 tablet daily  ?- Continue Januvia 100 mg daily  ?- Stop Insulin for now, if your sugars are consistently over 150 , either restart Basaglar or let me know and I will prescribe Glipizide pill to replace insulin  ? ? ? ? ?HOW TO TREAT LOW BLOOD SUGARS (Blood sugar LESS THAN 70 MG/DL) ?Please follow the RULE OF 15 for the treatment of hypoglycemia treatment (when your (blood sugars are less than 70 mg/dL)  ? ?STEP 1: Take 15 grams of carbohydrates when your blood sugar is low, which includes:  ?3-4 GLUCOSE TABS  OR ?3-4 OZ OF JUICE OR REGULAR SODA OR ?ONE TUBE OF GLUCOSE GEL   ? ?STEP 2: RECHECK blood sugar in 15 MINUTES ?STEP 3: If your blood sugar is still low at the 15 minute recheck --> then, go back to STEP 1 and treat AGAIN with another 15 grams of carbohydrates. ? ?

## 2021-05-10 ENCOUNTER — Encounter: Payer: Self-pay | Admitting: Family Medicine

## 2021-05-10 ENCOUNTER — Ambulatory Visit (INDEPENDENT_AMBULATORY_CARE_PROVIDER_SITE_OTHER): Payer: BC Managed Care – PPO | Admitting: Family Medicine

## 2021-05-10 DIAGNOSIS — M24111 Other articular cartilage disorders, right shoulder: Secondary | ICD-10-CM

## 2021-05-10 NOTE — Assessment & Plan Note (Signed)
Having minor pain with certain movements. Does well actively.  ?- counseled on home exercise therapy and supportive care ?- shockwave therapy  ?- could consider further imaging.  ?

## 2021-05-10 NOTE — Progress Notes (Signed)
?  Ethan Holt - 61 y.o. male MRN [id number]  Date of birth: 1960-11-05 ? ?SUBJECTIVE:  Including CC & ROS.  ?No chief complaint on file. ? ? ?Ethan Holt is a 61 y.o. male that is  following up for his right shoulder pain. Has gotten relief with the injection but still notices the pain in certain movements. No pain with being active with throwing or batting. ? ? ?Review of Systems ?See HPI  ? ?HISTORY: Past Medical, Surgical, Social, and Family History Reviewed & Updated per EMR.   ?Pertinent Historical Findings include: ? ?Past Medical History:  ?Diagnosis Date  ? Allergy   ? spring  ? Asthma   ? as child   ? Diabetes mellitus without complication ([place])   ? ? ?History reviewed. No pertinent surgical history. ? ? ?PHYSICAL EXAM:  ?VS: BP 108/80 (BP Location: Left Arm, Patient Position: Sitting)   Ht 5\' 9"  (1.753 m)   Wt 190 lb (86.2 kg)   BMI 28.06 kg/m?  ?Physical Exam ?Gen: NAD, alert, cooperative with exam, well-appearing ?MSK:  ?Neurovascularly intact   ? ?ECSWT Note ?Ethan Holt ?[birth year] ? ?Procedure: ECSWT ?Indications: right shoulder pain  ? ?Procedure Details ?Consent: Risks of procedure as well as the alternatives and risks of each were explained to the (patient/caregiver).  Consent for procedure obtained. ?Time Out: Verified patient identification, verified procedure, site/side was marked, verified correct patient position, special equipment/implants available, medications/allergies/relevent history reviewed, required imaging and test results available.  Performed.  The area was cleaned with iodine and alcohol swabs.   ? ?The right shoulder was targeted for Extracorporeal shockwave therapy.  ? ?Preset: shoulder problem ?Power Level: 90 ?Frequency: 10 ?Impulse/cycles: 2400 ?Head size: medium  ?Session: 1st ? ?Patient did tolerate procedure well. ? ? ? ?ASSESSMENT & PLAN:  ? ?Labral tear of shoulder, degenerative, right ?Having minor pain with certain movements. Does well actively.  ?- counseled on home  exercise therapy and supportive care ?- shockwave therapy  ?- could consider further imaging.  ? ? ? ? ?

## 2021-05-15 ENCOUNTER — Telehealth: Payer: Self-pay

## 2021-05-15 ENCOUNTER — Encounter: Payer: BC Managed Care – PPO | Admitting: Registered"

## 2021-05-15 NOTE — Telephone Encounter (Signed)
Paperwork has been completed and faxed to [facility]. Called patient twice no answer, no vm. ?

## 2021-05-15 NOTE — Telephone Encounter (Signed)
Patient dropped off DOT forms I let patient know the time frame for patient forms and he was upset but let him know I would still give paper work to Dr's MA  ? ? ?Please advise  ?

## 2021-05-15 NOTE — Telephone Encounter (Signed)
Patient will bring forms by to be filled out regarding DOT on insulin use.  ?

## 2021-05-26 ENCOUNTER — Ambulatory Visit: Payer: BC Managed Care – PPO | Admitting: Family Medicine

## 2021-05-31 ENCOUNTER — Other Ambulatory Visit: Payer: Self-pay | Admitting: Medical

## 2021-07-12 ENCOUNTER — Ambulatory Visit: Payer: BC Managed Care – PPO | Admitting: Registered"

## 2021-08-15 ENCOUNTER — Ambulatory Visit: Payer: BC Managed Care – PPO | Admitting: Internal Medicine

## 2021-08-15 NOTE — Progress Notes (Deleted)
Name: Ethan Holt  MRN/ DOB: [id number], August 29, 1960   Age/ Sex: 61 y.o., male    PCP: Marisue Brooklyn   Reason for Endocrinology Evaluation: Type 2 Diabetes Mellitus     Date of Initial Endocrinology Visit: 04/11/2021    PATIENT IDENTIFIER: [name] is a 61 y.o. male with a past medical history of T2DM, dyslipidemia and Asthma . The patient presented for initial endocrinology clinic visit on 04/11/2021 for consultative assistance with his diabetes management.    HPI: [name] was    Diagnosed with DM 2020 Prior Medications tried/Intolerance: Metformin - made him feel funny . Insulin started 01/2022 Hemoglobin A1c has ranged from 6.9% in 08/2018, peaking at 14.9% in 02/2018.    He is active person running and softball   On his initial visit to our clinic he had an A1c of 10.4% , he was on insulin and Januvia, We continued Januvia and insulin  and started Blair Heys was stopped by March 2023 with A1c of 7.2%  SUBJECTIVE:   During the last visit (05/09/2021): A1c 7.2 % continue Jardiance and Januvia, stopped Basaglar     Today (08/15/21): Ethan Holt is here for a follow up on diabetes management. He checks his blood sugars 3 times daily. Denies hypoglycemia since his last visit .   He denies nausea, vomiting or diarrhea      HOME DIABETES REGIMEN: Januvia 100 mg daily   Jardiance 25 mg daily       Statin: yes ACE-I/ARB: no Prior Diabetic Education: yes    METER DOWNLOAD SUMMARY: did not bring      DIABETIC COMPLICATIONS: Microvascular complications:   Denies: CKD, neuropathy , retinopathy  Last eye exam: Completed 2022  Macrovascular complications:   Denies: CAD, PVD, CVA   PAST HISTORY: Past Medical History:  Past Medical History:  Diagnosis Date   Allergy    spring   Asthma    as child    Diabetes mellitus without complication (HCC)    Past Surgical History: No past surgical history on file.  Social History:   reports that he has never smoked. He uses smokeless tobacco. He reports that he does not drink alcohol and does not use drugs. Family History: No family history on file.   HOME MEDICATIONS: Allergies as of 08/15/2021   No Known Allergies      Medication List        Accurate as of August 15, 2021  1:10 PM. If you have any questions, ask your nurse or doctor.          atorvastatin 10 MG tablet Commonly known as: LIPITOR TAKE 1 TABLET BY MOUTH EVERY DAY   empagliflozin 25 MG Tabs tablet Commonly known as: Jardiance Take 1 tablet (25 mg total) by mouth daily before breakfast.   FreeStyle Libre 2 Sensor Misc 1 Device by Does not apply route every 14 (fourteen) days.   ibuprofen 800 MG tablet Commonly known as: ADVIL Take 1 tablet (800 mg total) by mouth 3 (three) times daily.   Insulin Pen Needle 32G X 4 MM Misc 1 Device by Does not apply route daily.   multivitamin with minerals Tabs tablet Take 1 tablet by mouth daily.   OVER THE COUNTER MEDICATION Take 1 capsule by mouth daily. NUGENIX   sitaGLIPtin 100 MG tablet Commonly known as: Januvia Take 1 tablet (100 mg total) by mouth daily.   VITAMIN B-12 PO Take 2 tablets by mouth daily.   VITAMIN  D PO Take by mouth.   ZINC PO Take by mouth.         ALLERGIES: No Known Allergies   REVIEW OF SYSTEMS: A comprehensive ROS was conducted with the patient and is negative except as per HPI    OBJECTIVE:   VITAL SIGNS: There were no vitals taken for this visit.   PHYSICAL EXAM:  General: Pt appears well and is in NAD  Neck: General: Supple without adenopathy or carotid bruits. Thyroid: Thyroid size normal.  No goiter or nodules appreciated.  Lungs: Clear with good BS bilat with no rales, rhonchi, or wheezes  Heart: RRR with normal S1 and S2 and no gallops; no murmurs; no rub  Abdomen: Normoactive bowel sounds, soft, nontender, without masses or organomegaly palpable  Extremities:  Lower extremities - No  pretibial edema. No lesions.  Neuro: MS is good with appropriate affect, pt is alert and Ox3    DM foot exam: 04/11/2021  The skin of the feet is intact without sores or ulcerations. The pedal pulses are 2+ on right and 2+ on left. The sensation is intact to a screening 5.07, 10 gram monofilament bilaterally   DATA REVIEWED:  Lab Results  Component Value Date   HGBA1C 7.2 (A) 05/09/2021   HGBA1C 10.4 (H) 03/07/2021   HGBA1C 11.7 (H) 02/10/2021   Lab Results  Component Value Date   MICROALBUR <0.7 11/29/2020   LDLCALC 134 (H) 11/29/2020   CREATININE 1.07 11/29/2020   Lab Results  Component Value Date   MICRALBCREAT 1.4 11/29/2020    Lab Results  Component Value Date   CHOL 192 11/29/2020   HDL 48.80 11/29/2020   LDLCALC 134 (H) 11/29/2020   TRIG 49.0 11/29/2020   CHOLHDL 4 11/29/2020        ASSESSMENT / PLAN / RECOMMENDATIONS:   1) Type 2 Diabetes Mellitus, optimally controlled, Without complications - Most recent A1c of 7.2 %. Goal A1c < 7.0 %.    - Praised the pt on improved glycemic control, A1c down from 10.4% to 7.2 %  - Will stop Basal insulin  - We discussed switching Januvia to GLP1 agonists in the future  - He was encouraged to continue glucose checks at home and to contact me with BG > 150 mg/dL, we may consider glipizide if needed     MEDICATIONS: Continue  Jardiance 25 mg ,1 tablet daily  Continue Januvia 100 mg daily  Stop  Basaglar 10 units daily   EDUCATION / INSTRUCTIONS: BG monitoring instructions: Patient is instructed to check his blood sugars 1 times a day, fasting . Call [facility] if: BG persistently < 70  I reviewed the Rule of 15 for the treatment of hypoglycemia in detail with the patient. Literature supplied.   2) Diabetic complications:  Eye: Does not have known diabetic retinopathy.  Neuro/ Feet: Does not have known diabetic peripheral neuropathy. Renal: Patient does not have known baseline CKD. He is not on  an ACEI/ARB at present.    F/U in 4 weeks     Signed electronically by: Mack Guise, MD  [facility]  [facility] [address] [place], [place] [address] Phone: [phone] FAX: [phone]   CC: Elise Benne [id number] [place] [address] Phone: [phone]  Fax: [phone]    Return to Endocrinology clinic as below: Future Appointments  Date Time Provider [place]  08/15/2021  2:40 PM Wallace Cogliano, Melanie Crazier, MD [facility]  None  09/18/2021 11:30 AM Carolan Shiver, RD [facility]

## 2021-09-18 ENCOUNTER — Ambulatory Visit: Payer: BC Managed Care – PPO | Admitting: Registered"

## 2022-01-03 ENCOUNTER — Telehealth: Payer: Self-pay | Admitting: Medical

## 2022-01-03 DIAGNOSIS — Z1211 Encounter for screening for malignant neoplasm of colon: Secondary | ICD-10-CM

## 2022-01-03 NOTE — Telephone Encounter (Signed)
Patient states he is due for his colonoscopy and would like to know what the next steps are. Please advise.

## 2022-01-03 NOTE — Telephone Encounter (Signed)
Referral to GI placed

## 2022-02-05 ENCOUNTER — Other Ambulatory Visit: Payer: Self-pay | Admitting: Internal Medicine

## 2022-02-07 ENCOUNTER — Telehealth: Payer: Self-pay | Admitting: Medical

## 2022-02-07 MED ORDER — EMPAGLIFLOZIN 25 MG PO TABS: 25.0000 mg | ORAL_TABLET | Freq: Every day | ORAL | 2 refills | Status: DC

## 2022-02-07 NOTE — Telephone Encounter (Signed)
Patient is requesting a refill on Jardiance to be sent to [facility]. Advised that he hasn't been seen in a while so he may need a visit before it will be refilled. Patient called back about his colonoscopy. Advised patient he was referred to [facility] and they have called him to schedule.

## 2022-02-07 NOTE — Telephone Encounter (Signed)
Appt scheduled and medication sent to pharmacy

## 2022-02-09 ENCOUNTER — Other Ambulatory Visit: Payer: Self-pay | Admitting: *Deleted

## 2022-02-09 ENCOUNTER — Other Ambulatory Visit: Payer: Self-pay | Admitting: Medical

## 2022-02-09 MED ORDER — ATORVASTATIN CALCIUM 10 MG PO TABS: 10.0000 mg | ORAL_TABLET | Freq: Every day | ORAL | 1 refills | Status: DC

## 2022-02-12 ENCOUNTER — Ambulatory Visit (INDEPENDENT_AMBULATORY_CARE_PROVIDER_SITE_OTHER): Payer: BC Managed Care – PPO | Admitting: Medical

## 2022-02-12 ENCOUNTER — Encounter: Payer: Self-pay | Admitting: Medical

## 2022-02-12 VITALS — BP 128/86 | HR 90 | Resp 18 | Ht 69.0 in | Wt 193.8 lb

## 2022-02-12 DIAGNOSIS — Z0000 Encounter for general adult medical examination without abnormal findings: Secondary | ICD-10-CM | POA: Diagnosis not present

## 2022-02-12 DIAGNOSIS — Z1211 Encounter for screening for malignant neoplasm of colon: Secondary | ICD-10-CM | POA: Diagnosis not present

## 2022-02-12 DIAGNOSIS — Z125 Encounter for screening for malignant neoplasm of prostate: Secondary | ICD-10-CM

## 2022-02-12 NOTE — Patient Instructions (Addendum)
For you wellness exam today I have ordered cbc, cmp lipid panel and psa. Future lab since you are not fasting. Schedule within next 2 weeks early am and come in fasting.  Declined flu vaccine and shingrix. If you change your mind let us know.  Recommend exercise and healthy diet.  We will let you know lab results as they come in.  Follow up date appointment will be determined after lab review.    Recommend follow up with Dr. Lonzo Cloud as you are due for follow up. 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  Preventive Care 63-42 Years Old, Male Preventive care refers to lifestyle choices and visits with your health care provider that can promote health and wellness. Preventive care visits are also called wellness exams. What can I expect for my preventive care visit? Counseling During your preventive care visit, your health care provider may ask about your: Medical history, including: Past medical problems. Family medical history. Current health, including: Emotional well-being. Home life and relationship well-being. Sexual activity. Lifestyle, including: Alcohol, nicotine or tobacco, and drug use. Access to firearms. Diet, exercise, and sleep habits. Safety issues such as seatbelt and bike helmet use. Sunscreen use. Work and work Astronomer. Physical exam Your health care provider will check your: Height and weight. These may be used to calculate your BMI (body mass index). BMI is a measurement that tells if you are at a healthy weight. Waist circumference. This measures the distance around your waistline. This measurement also tells if you are at a healthy weight and may help predict your risk of certain diseases, such as type 2 diabetes and high blood pressure. Heart rate and blood pressure. Body temperature. Skin for abnormal spots. What immunizations do I need?  Vaccines are usually given at various ages, according to a schedule. Your health care provider will  recommend vaccines for you based on your age, medical history, and lifestyle or other factors, such as travel or where you work. What tests do I need? Screening Your health care provider may recommend screening tests for certain conditions. This may include: Lipid and cholesterol levels. Diabetes screening. This is done by checking your blood sugar (glucose) after you have not eaten for a while (fasting). Hepatitis B test. Hepatitis C test. HIV (human immunodeficiency virus) test. STI (sexually transmitted infection) testing, if you are at risk. Lung cancer screening. Prostate cancer screening. Colorectal cancer screening. Talk with your health care provider about your test results, treatment options, and if necessary, the need for more tests. Follow these instructions at home: Eating and drinking  Eat a diet that includes fresh fruits and vegetables, whole grains, lean protein, and low-fat dairy products. Take vitamin and mineral supplements as recommended by your health care provider. Do not drink alcohol if your health care provider tells you not to drink. If you drink alcohol: Limit how much you have to 0-2 drinks a day. Know how much alcohol is in your drink. In the [place] one drink equals one 12 oz bottle of beer (355 mL), one 5 oz glass of wine (148 mL), or one 1 oz glass of hard liquor (44 mL). Lifestyle Brush your teeth every morning and night with fluoride toothpaste. Floss one time each day. Exercise for at least 30 minutes 5 or more days each week. Do not use any products that contain nicotine or tobacco. These products include cigarettes, chewing tobacco, and vaping devices, such as e-cigarettes. If you need help quitting, ask your health care provider.  Do not use drugs. If you are sexually active, practice safe sex. Use a condom or other form of protection to prevent STIs. Take aspirin only as told by your health care provider. Make sure that you understand how much to take  and what form to take. Work with your health care provider to find out whether it is safe and beneficial for you to take aspirin daily. Find healthy ways to manage stress, such as: Meditation, yoga, or listening to music. Journaling. Talking to a trusted person. Spending time with friends and family. Minimize exposure to UV radiation to reduce your risk of skin cancer. Safety Always wear your seat belt while driving or riding in a vehicle. Do not drive: If you have been drinking alcohol. Do not ride with someone who has been drinking. When you are tired or distracted. While texting. If you have been using any mind-altering substances or drugs. Wear a helmet and other protective equipment during sports activities. If you have firearms in your house, make sure you follow all gun safety procedures. What's next? Go to your health care provider once a year for an annual wellness visit. Ask your health care provider how often you should have your eyes and teeth checked. Stay up to date on all vaccines. This information is not intended to replace advice given to you by your health care provider. Make sure you discuss any questions you have with your health care provider. Document Revised: 08/10/2020 Document Reviewed: 08/10/2020 Elsevier Patient Education  2023 [employer].

## 2022-02-12 NOTE — Progress Notes (Signed)
Subjective:    Patient ID: Ethan Holt, male    DOB: [birth year], 61 y.o.   MRN: [id number]  HPI Pt in for follow up.  I have not seen him last year. 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 Some recreational softball twice a week.      Will do wellness exam today. He is not fasting.   Declines flu vaccine and shingrix. Tdap done in 2017 at [place].   He is diabetic. Last A1c was 7.2 in epic. He states his level came all the way down to 5. On review some diabetic maintenance not up to date. Has not seen endocrinologist since march.      Review of Systems  Constitutional:  Negative for chills, fatigue and fever.  Respiratory:  Negative for cough, chest tightness and wheezing.   Cardiovascular:  Negative for chest pain and palpitations.  Gastrointestinal:  Negative for abdominal pain, blood in stool, nausea and vomiting.  Genitourinary:  Negative for dysuria, flank pain and frequency.  Musculoskeletal:  Negative for back pain, joint swelling, myalgias and neck stiffness.  Skin:  Negative for rash.  Neurological:  Negative for dizziness, weakness and numbness.  Hematological:  Negative for adenopathy. Does not bruise/bleed easily.  Psychiatric/Behavioral:  Negative for behavioral problems, decreased concentration, dysphoric mood and suicidal ideas. The patient is not nervous/anxious and is not hyperactive.      Past Medical History:  Diagnosis Date   Allergy    spring   Asthma    as child    Diabetes mellitus without complication (HCC)      Social History   Socioeconomic History   Marital status: Married    Spouse name: Not on file   Number of children: Not on file   Years of education: Not on file   Highest education level: Not on file  Occupational History   Not on file  Tobacco Use   Smoking status: Never   Smokeless tobacco: Current  Substance and Sexual Activity   Alcohol use: No   Drug use: No   Sexual activity: Not on file   Other Topics Concern   Not on file  Social History Narrative   Not on file   Social Determinants of Health   Financial Resource Strain: Not on file  Food Insecurity: Not on file  Transportation Needs: Not on file  Physical Activity: Not on file  Stress: Not on file  Social Connections: Not on file  Intimate Partner Violence: Not on file    No past surgical history on file.  No family history on file.  No Known Allergies  Current Outpatient Medications on File Prior to Visit  Medication Sig Dispense Refill   atorvastatin (LIPITOR) 10 MG tablet Take 1 tablet (10 mg total) by mouth daily. 90 tablet 1   Continuous Blood Gluc Sensor (FREESTYLE LIBRE 2 SENSOR) MISC 1 Device by Does not apply route every 14 (fourteen) days. 6 each 3   Cyanocobalamin (VITAMIN B-12 PO) Take 2 tablets by mouth daily.      empagliflozin (JARDIANCE) 25 MG TABS tablet Take 1 tablet (25 mg total) by mouth daily before breakfast. 90 tablet 2   ibuprofen (ADVIL,MOTRIN) 800 MG tablet Take 1 tablet (800 mg total) by mouth 3 (three) times daily. 21 tablet 0   Insulin Pen Needle 32G X 4 MM MISC 1 Device by Does not apply route daily. 100 each 3   Multiple Vitamin (MULTIVITAMIN WITH MINERALS) TABS Take 1  tablet by mouth daily.     Multiple Vitamins-Minerals (ZINC PO) Take by mouth.     OVER THE COUNTER MEDICATION Take 1 capsule by mouth daily. NUGENIX      sitaGLIPtin (JANUVIA) 100 MG tablet Take 1 tablet (100 mg total) by mouth daily. 90 tablet 1   VITAMIN D PO Take by mouth.     [DISCONTINUED] linagliptin (TRADJENTA) 5 MG TABS tablet Take 1 tablet (5 mg total) by mouth daily. 30 tablet 3   No current facility-administered medications on file prior to visit.    BP 128/86   Pulse 90   Resp 18   Ht 5\' 9"  (1.753 m)   Wt 193 lb 12.8 oz (87.9 kg)   SpO2 99%   BMI 28.62 kg/m        Objective:   Physical Exam  General Mental Status- Alert. General Appearance- Not in acute distress.   Skin General:  Color- Normal Color. Moisture- Normal Moisture.  Neck Carotid Arteries- Normal color. Moisture- Normal Moisture. No carotid bruits. No JVD.  Chest and Lung Exam Auscultation: Breath Sounds:-Normal.  Cardiovascular Auscultation:Rythm- Regular. Murmurs & Other Heart Sounds:Auscultation of the heart reveals- No Murmurs.  Abdomen Inspection:-Inspeection Normal. Palpation/Percussion:Note:No mass. Palpation and Percussion of the abdomen reveal- Non Tender, Non Distended + BS, no rebound or guarding.   Neurologic Cranial Nerve exam:- CN III-XII intact(No nystagmus), symmetric smile. Strength:- 5/5 equal and symmetric strength both upper and lower extremities.       Assessment & Plan:   Patient Instructions  For you wellness exam today I have ordered cbc, cmp lipid panel and psa. Future lab since you are not fasting. Schedule within next 2 weeks early am and come in fasting.  Declined flu vaccine and shingrix. If you change your mind let know.  Recommend exercise and healthy diet.  We will let you know lab results as they come in.  Follow up date appointment will be determined after lab review.    Recommend follow up with Dr. [place] as you are due for follow up. 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        April, PA-C

## 2022-03-02 ENCOUNTER — Ambulatory Visit: Payer: BC Managed Care – PPO | Admitting: Medical

## 2022-03-05 ENCOUNTER — Ambulatory Visit (INDEPENDENT_AMBULATORY_CARE_PROVIDER_SITE_OTHER): Payer: BC Managed Care – PPO | Admitting: Medical

## 2022-03-05 VITALS — BP 114/84 | HR 80 | Temp 98.0°F | Resp 18 | Ht 69.0 in | Wt 191.0 lb

## 2022-03-05 DIAGNOSIS — J069 Acute upper respiratory infection, unspecified: Secondary | ICD-10-CM

## 2022-03-05 DIAGNOSIS — Z125 Encounter for screening for malignant neoplasm of prostate: Secondary | ICD-10-CM

## 2022-03-05 DIAGNOSIS — E1165 Type 2 diabetes mellitus with hyperglycemia: Secondary | ICD-10-CM

## 2022-03-05 DIAGNOSIS — Z794 Long term (current) use of insulin: Secondary | ICD-10-CM

## 2022-03-05 DIAGNOSIS — Z0000 Encounter for general adult medical examination without abnormal findings: Secondary | ICD-10-CM | POA: Diagnosis not present

## 2022-03-05 DIAGNOSIS — M25511 Pain in right shoulder: Secondary | ICD-10-CM

## 2022-03-05 DIAGNOSIS — G8929 Other chronic pain: Secondary | ICD-10-CM

## 2022-03-05 MED ORDER — FLUTICASONE PROPIONATE 50 MCG/ACT NA SUSP: 2.0000 | Freq: Every day | NASAL | 1 refills | Status: AC

## 2022-03-05 MED ORDER — IBUPROFEN 800 MG PO TABS: 800.0000 mg | ORAL_TABLET | Freq: Three times a day (TID) | ORAL | 0 refills | Status: AC | PRN

## 2022-03-05 MED ORDER — BENZONATATE 100 MG PO CAPS: 100.0000 mg | ORAL_CAPSULE | Freq: Three times a day (TID) | ORAL | 0 refills | Status: DC | PRN

## 2022-03-05 NOTE — Progress Notes (Addendum)
Subjective:    Patient ID: Ethan Holt, male    DOB: Sep 21, 1960, 62 y.o.   MRN: [id number]  HPI  Pt in for follow up. He had wellness exam on last visit. I just wanted him to get wellness exam labs fasting within 2 weeks. On review it looks like GI office tried to call and get him scheduled for colonoscopy.  He does states just got head congestion. He states just started on Saturday. He felt some pnd and mild cough. No fever, no chills, no sweats or bodyaches. Pt drives a box truck. He wants to know if he needs to take day off. No sob or wheezing.   Also at end wanted refill of ibuprofen for occasional shoulder pain.  Prior US.   Limited ultrasound: Right shoulder:   Effusion noted that the superior portion of the proximal biceps tendon. Effusion noted of the subscapularis on dynamic testing. Mild subacromial bursitis. Effusion of the posterior glenohumeral joint.   Summary: Effusion of the shoulder   Review of Systems  Constitutional:  Negative for chills and fever.  HENT:  Positive for congestion.   Respiratory:  Negative for cough, chest tightness, shortness of breath and wheezing.   Cardiovascular:  Negative for chest pain and palpitations.  Gastrointestinal:  Negative for abdominal pain.  Musculoskeletal:  Negative for back pain and myalgias.  Neurological:  Negative for dizziness, syncope, weakness, numbness and headaches.  Hematological:  Negative for adenopathy. Does not bruise/bleed easily.  Psychiatric/Behavioral:  Negative for behavioral problems, decreased concentration and dysphoric mood.     Past Medical History:  Diagnosis Date   Allergy    spring   Asthma    as child    Diabetes mellitus without complication (HCC)      Social History   Socioeconomic History   Marital status: Married    Spouse name: Not on file   Number of children: Not on file   Years of education: Not on file   Highest education level: Not on file  Occupational History   Not  on file  Tobacco Use   Smoking status: Never   Smokeless tobacco: Never  Substance and Sexual Activity   Alcohol use: No   Drug use: No   Sexual activity: Not on file  Other Topics Concern   Not on file  Social History Narrative   Not on file   Social Determinants of Health   Financial Resource Strain: Not on file  Food Insecurity: Not on file  Transportation Needs: Not on file  Physical Activity: Not on file  Stress: Not on file  Social Connections: Not on file  Intimate Partner Violence: Not on file    No past surgical history on file.  No family history on file.  No Known Allergies  Current Outpatient Medications on File Prior to Visit  Medication Sig Dispense Refill   atorvastatin (LIPITOR) 10 MG tablet Take 1 tablet (10 mg total) by mouth daily. 90 tablet 1   Continuous Blood Gluc Sensor (FREESTYLE LIBRE 2 SENSOR) MISC 1 Device by Does not apply route every 14 (fourteen) days. 6 each 3   Cyanocobalamin (VITAMIN B-12 PO) Take 2 tablets by mouth daily.      empagliflozin (JARDIANCE) 25 MG TABS tablet Take 1 tablet (25 mg total) by mouth daily before breakfast. 90 tablet 2   ibuprofen (ADVIL,MOTRIN) 800 MG tablet Take 1 tablet (800 mg total) by mouth 3 (three) times daily. 21 tablet 0   Insulin Pen Needle 32G  X 4 MM MISC 1 Device by Does not apply route daily. 100 each 3   Multiple Vitamin (MULTIVITAMIN WITH MINERALS) TABS Take 1 tablet by mouth daily.     Multiple Vitamins-Minerals (ZINC PO) Take by mouth.     OVER THE COUNTER MEDICATION Take 1 capsule by mouth daily. NUGENIX      sitaGLIPtin (JANUVIA) 100 MG tablet Take 1 tablet (100 mg total) by mouth daily. 90 tablet 1   VITAMIN D PO Take by mouth.     [DISCONTINUED] linagliptin (TRADJENTA) 5 MG TABS tablet Take 1 tablet (5 mg total) by mouth daily. 30 tablet 3   No current facility-administered medications on file prior to visit.    BP 114/84   Pulse 80   Temp 98 F (36.7 C)   Resp 18   Ht 5\' 9"  (1.753 m)    Wt 191 lb (86.6 kg)   SpO2 98%   BMI 28.21 kg/m        Objective:   Physical Exam  General- No acute distress. Pleasant patient. Neck- Full range of motion, no jvd Lungs- Clear, even and unlabored. Heart- regular rate and rhythm. Neurologic- CNII- XII grossly intact.  Heent- no frontal or maxillary sinus pressure. Canals clear- normal tm. Mild PND.      Assessment & Plan:   Patient Instructions  Early uri- will rx flonase nasal spray and benzonatate for cough. Minimal symptoms. If worsen recommend check for covid. If you end up testing and + let me know.   Go ahead and go to labs. Have them pull future labs I placed on last visit.  Follow up date to be determined after lab review.   Mackie Pai, PA-C

## 2022-03-05 NOTE — Addendum Note (Signed)
Addended by: Anabel Halon on: 03/05/2022 04:16 PM   Modules accepted: Orders

## 2022-03-05 NOTE — Patient Instructions (Addendum)
Early uri- will rx flonase nasal spray and benzonatate for cough. Minimal symptoms. If worsen recommend check for covid. If you end up testing and + let me know.   Go ahead and go to labs. Have them pull future labs I placed on last visits wellness exam.  For shoulder pain intermittent rx ibuprofen. But if pain constant daily will refer back to sports med.  Follow up date to be determined after lab review.

## 2022-03-06 ENCOUNTER — Telehealth: Payer: Self-pay | Admitting: *Deleted

## 2022-03-06 LAB — CBC WITH DIFFERENTIAL/PLATELET
Basophils Absolute: 0 10*3/uL (ref 0.0–0.1)
Basophils Relative: 1 % (ref 0.0–3.0)
Eosinophils Absolute: 0.1 10*3/uL (ref 0.0–0.7)
Eosinophils Relative: 1.8 % (ref 0.0–5.0)
HCT: 41.4 % (ref 39.0–52.0)
Hemoglobin: 13.6 g/dL (ref 13.0–17.0)
Lymphocytes Relative: 39.2 % (ref 12.0–46.0)
Lymphs Abs: 1.6 10*3/uL (ref 0.7–4.0)
MCHC: 33 g/dL (ref 30.0–36.0)
MCV: 73.2 fl — ABNORMAL LOW (ref 78.0–100.0)
Monocytes Absolute: 0.6 10*3/uL (ref 0.1–1.0)
Monocytes Relative: 15.5 % — ABNORMAL HIGH (ref 3.0–12.0)
Neutro Abs: 1.8 10*3/uL (ref 1.4–7.7)
Neutrophils Relative %: 42.5 % — ABNORMAL LOW (ref 43.0–77.0)
Platelets: 268 10*3/uL (ref 150.0–400.0)
RBC: 5.65 Mil/uL (ref 4.22–5.81)
RDW: 15.5 % (ref 11.5–15.5)
WBC: 4.2 10*3/uL (ref 4.0–10.5)

## 2022-03-06 NOTE — Telephone Encounter (Signed)
Received call from [place] lab that pt's blood sample from last night had hemolyzed and would need re-collection. Notified pt and scheduled lab appt for Friday at 3:15.

## 2022-03-06 NOTE — Addendum Note (Signed)
Addended by: Kelle Darting A on: 03/06/2022 12:08 PM   Modules accepted: Orders

## 2022-03-09 ENCOUNTER — Other Ambulatory Visit: Payer: BC Managed Care – PPO

## 2022-03-09 NOTE — Addendum Note (Signed)
Addended by: Shaquanta Harkless on: 03/09/2022 08:12 AM   Modules accepted: Orders  

## 2022-03-09 NOTE — Addendum Note (Signed)
Addended by: Manuela Schwartz on: 03/09/2022 08:12 AM   Modules accepted: Orders

## 2022-03-09 NOTE — Addendum Note (Signed)
Addended by: Kameelah Minish on: 03/09/2022 08:12 AM   Modules accepted: Orders  

## 2022-03-12 ENCOUNTER — Other Ambulatory Visit: Payer: BC Managed Care – PPO

## 2022-03-12 DIAGNOSIS — Z125 Encounter for screening for malignant neoplasm of prostate: Secondary | ICD-10-CM

## 2022-03-12 DIAGNOSIS — Z0000 Encounter for general adult medical examination without abnormal findings: Secondary | ICD-10-CM

## 2022-03-13 LAB — LIPID PANEL
Cholesterol: 138 mg/dL (ref <200)
HDL: 39 mg/dL — ABNORMAL LOW (ref >=40)
LDL Cholesterol (Calc): 89 mg/dL (calc)
Non-HDL Cholesterol (Calc): 99 mg/dL (calc) (ref <130)
Total CHOL/HDL Ratio: 3.5 (calc) (ref <5.0)
Triglycerides: 36 mg/dL (ref <150)

## 2022-03-13 LAB — COMPREHENSIVE METABOLIC PANEL
AG Ratio: 1.5 (calc) (ref 1.0–2.5)
ALT: 28 U/L (ref 9–46)
AST: 18 U/L (ref 10–35)
Albumin: 4.2 g/dL (ref 3.6–5.1)
Alkaline phosphatase (APISO): 69 U/L (ref 35–144)
BUN: 16 mg/dL (ref 7–25)
CO2: 27 mmol/L (ref 20–32)
Calcium: 10 mg/dL (ref 8.6–10.3)
Chloride: 104 mmol/L (ref 98–110)
Creat: 1.01 mg/dL (ref 0.70–1.35)
Globulin: 2.8 g/dL (calc) (ref 1.9–3.7)
Glucose, Bld: 81 mg/dL (ref 65–99)
Potassium: 4.2 mmol/L (ref 3.5–5.3)
Sodium: 140 mmol/L (ref 135–146)
Total Bilirubin: 0.8 mg/dL (ref 0.2–1.2)
Total Protein: 7 g/dL (ref 6.1–8.1)

## 2022-03-13 LAB — PSA: PSA: 0.87 ng/mL (ref <=4.00)

## 2022-03-19 ENCOUNTER — Telehealth: Payer: Self-pay

## 2022-03-19 NOTE — Telephone Encounter (Signed)
Pt called into office and labs reviewed , made pt aware to contact endo

## 2022-04-23 ENCOUNTER — Telehealth: Payer: Self-pay | Admitting: Medical

## 2022-04-23 NOTE — Telephone Encounter (Signed)
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 Advised pt that  the office does not do DOT physicals in our office and it would have to be facilitated elsewhere. 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 Pt asked if his blood work could be done with our office standalone. Advised that for it to be done correctly, it would probably have to be done at the same place where the physical is done. Pt asked if [facility]. Could give him a call when possible to go over this as they did do it last year. Advised a message would be sent back to look into this. Pt acknowledged understanding.

## 2022-04-25 NOTE — Addendum Note (Signed)
Addended by: Anabel Halon on: 04/25/2022 04:09 PM   Modules accepted: Orders

## 2022-04-25 NOTE — Telephone Encounter (Signed)
Pt called and made a lab appt , made him aware that anything diabetic related needs to be discussed with endo , pt verbalized understanding

## 2022-04-25 NOTE — Telephone Encounter (Signed)
What about the bloodwork

## 2022-04-26 ENCOUNTER — Other Ambulatory Visit: Payer: BC Managed Care – PPO

## 2022-04-30 ENCOUNTER — Other Ambulatory Visit (INDEPENDENT_AMBULATORY_CARE_PROVIDER_SITE_OTHER): Payer: BC Managed Care – PPO

## 2022-04-30 DIAGNOSIS — Z794 Long term (current) use of insulin: Secondary | ICD-10-CM | POA: Diagnosis not present

## 2022-04-30 DIAGNOSIS — E1165 Type 2 diabetes mellitus with hyperglycemia: Secondary | ICD-10-CM

## 2022-04-30 LAB — HEMOGLOBIN A1C: Hgb A1c MFr Bld: 7.9 % — ABNORMAL HIGH (ref 4.6–6.5)

## 2022-05-01 ENCOUNTER — Telehealth: Payer: Self-pay | Admitting: Medical

## 2022-05-01 NOTE — Telephone Encounter (Signed)
Pt sated he cannot get into his mychart and would like the results printed so he can pick them up.

## 2022-05-02 NOTE — Telephone Encounter (Signed)
Pt.notified

## 2022-05-18 DIAGNOSIS — Z111 Encounter for screening for respiratory tuberculosis: Secondary | ICD-10-CM | POA: Diagnosis not present

## 2022-05-18 DIAGNOSIS — Z789 Other specified health status: Secondary | ICD-10-CM | POA: Diagnosis not present

## 2022-06-11 ENCOUNTER — Encounter: Payer: Self-pay | Admitting: *Deleted

## 2022-07-30 DIAGNOSIS — M436 Torticollis: Secondary | ICD-10-CM | POA: Diagnosis not present

## 2022-07-30 DIAGNOSIS — G44209 Tension-type headache, unspecified, not intractable: Secondary | ICD-10-CM | POA: Diagnosis not present

## 2022-07-30 DIAGNOSIS — M542 Cervicalgia: Secondary | ICD-10-CM | POA: Diagnosis not present

## 2022-07-30 DIAGNOSIS — G4459 Other complicated headache syndrome: Secondary | ICD-10-CM | POA: Diagnosis not present

## 2022-08-16 ENCOUNTER — Other Ambulatory Visit: Payer: Self-pay | Admitting: Medical

## 2022-08-23 ENCOUNTER — Telehealth: Payer: Self-pay | Admitting: Medical

## 2022-08-23 NOTE — Addendum Note (Signed)
Addended by: Gwenevere Abbot on: 08/23/2022 09:50 PM   Modules accepted: Orders

## 2022-08-23 NOTE — Telephone Encounter (Signed)
Looks like shoulder issue was discussed back January.  Should we have him reach back out to sports med or follow up here or can he just get brace otc?

## 2022-08-23 NOTE — Telephone Encounter (Signed)
Pt caled to ask if a shoulder brace would be a recommended treatment option for his shoulder issues and to see if his insurance would cover it. Advised pt to reach out to insurance to see what coverage would be offered on that.

## 2022-08-24 NOTE — Telephone Encounter (Signed)
Patient notified that referral has been placed and that ins will not pay for brace if he gets at retail pharmacy.

## 2022-08-24 NOTE — Telephone Encounter (Signed)
Left detailed about message below,

## 2022-10-24 ENCOUNTER — Other Ambulatory Visit: Payer: Self-pay | Admitting: Medical

## 2022-11-07 ENCOUNTER — Ambulatory Visit: Payer: BC Managed Care – PPO | Admitting: Medical

## 2022-11-07 DIAGNOSIS — K219 Gastro-esophageal reflux disease without esophagitis: Secondary | ICD-10-CM | POA: Diagnosis not present

## 2022-11-07 DIAGNOSIS — D509 Iron deficiency anemia, unspecified: Secondary | ICD-10-CM | POA: Diagnosis not present

## 2022-11-07 DIAGNOSIS — E119 Type 2 diabetes mellitus without complications: Secondary | ICD-10-CM | POA: Diagnosis not present

## 2022-11-07 DIAGNOSIS — E7800 Pure hypercholesterolemia, unspecified: Secondary | ICD-10-CM | POA: Diagnosis not present

## 2022-11-07 DIAGNOSIS — E559 Vitamin D deficiency, unspecified: Secondary | ICD-10-CM | POA: Diagnosis not present

## 2022-11-09 ENCOUNTER — Ambulatory Visit (INDEPENDENT_AMBULATORY_CARE_PROVIDER_SITE_OTHER): Payer: BC Managed Care – PPO | Admitting: Medical

## 2022-11-09 VITALS — BP 120/75 | HR 78 | Temp 97.8°F | Resp 18 | Ht 69.0 in | Wt 187.8 lb

## 2022-11-09 DIAGNOSIS — Z7984 Long term (current) use of oral hypoglycemic drugs: Secondary | ICD-10-CM

## 2022-11-09 DIAGNOSIS — Z1211 Encounter for screening for malignant neoplasm of colon: Secondary | ICD-10-CM

## 2022-11-09 DIAGNOSIS — E119 Type 2 diabetes mellitus without complications: Secondary | ICD-10-CM

## 2022-11-09 DIAGNOSIS — E785 Hyperlipidemia, unspecified: Secondary | ICD-10-CM

## 2022-11-09 NOTE — Progress Notes (Signed)
Subjective:    Patient ID: Ethan Holt, male    DOB: 12/15/1960, 62 y.o.   MRN: [id number]  HPI  Pt in for check up for chronic med problems.  He is aware he needs colonoscopy. He stats more than 10 years. I have placed referral in past.  6 month ago A1c was 7.9. Pt is still on jardiance 25 mg daily.  Reports moderate healthy diet. Pt still exercising. He plays softball.   Last lipid panel well controlled. Pt is still on atrovastatin.  Pt declines flu vaccine.    Review of Systems  Constitutional:  Negative for chills, fatigue and fever.  Respiratory:  Negative for chest tightness, shortness of breath and wheezing.   Cardiovascular:  Negative for chest pain and palpitations.  Gastrointestinal:  Negative for abdominal pain and blood in stool.  Genitourinary:  Negative for dysuria and frequency.  Musculoskeletal:  Negative for back pain and joint swelling.  Skin:  Negative for rash.  Neurological:  Negative for dizziness and headaches.  Hematological:  Negative for adenopathy. Does not bruise/bleed easily.     Past Medical History:  Diagnosis Date   Allergy    spring   Asthma    as child    Diabetes mellitus without complication (HCC)      Social History   Socioeconomic History   Marital status: Married    Spouse name: Not on file   Number of children: Not on file   Years of education: Not on file   Highest education level: Not on file  Occupational History   Not on file  Tobacco Use   Smoking status: Never   Smokeless tobacco: Never  Substance and Sexual Activity   Alcohol use: No   Drug use: No   Sexual activity: Not on file  Other Topics Concern   Not on file  Social History Narrative   Not on file   Social Determinants of Health   Financial Resource Strain: Not on file  Food Insecurity: Not on file  Transportation Needs: Not on file  Physical Activity: Not on file  Stress: Not on file  Social Connections: Not on file  Intimate Partner  Violence: Not on file    No past surgical history on file.  No family history on file.  No Known Allergies  Current Outpatient Medications on File Prior to Visit  Medication Sig Dispense Refill   atorvastatin (LIPITOR) 10 MG tablet TAKE 1 TABLET BY MOUTH EVERY DAY 90 tablet 1   Continuous Blood Gluc Sensor (FREESTYLE LIBRE 2 SENSOR) MISC 1 Device by Does not apply route every 14 (fourteen) days. 6 each 3   Cyanocobalamin (VITAMIN B-12 PO) Take 2 tablets by mouth daily.      empagliflozin (JARDIANCE) 25 MG TABS tablet Take 1 tablet (25 mg total) by mouth daily before breakfast. 30 tablet 0   fluticasone (FLONASE) 50 MCG/ACT nasal spray Place 2 sprays into both nostrils daily. 16 g 1   ibuprofen (ADVIL) 800 MG tablet Take 1 tablet (800 mg total) by mouth every 8 (eight) hours as needed. 30 tablet 0   ibuprofen (ADVIL,MOTRIN) 800 MG tablet Take 1 tablet (800 mg total) by mouth 3 (three) times daily. 21 tablet 0   Insulin Pen Needle 32G X 4 MM MISC 1 Device by Does not apply route daily. 100 each 3   Multiple Vitamin (MULTIVITAMIN WITH MINERALS) TABS Take 1 tablet by mouth daily.     Multiple Vitamins-Minerals (ZINC PO) Take by mouth.  OVER THE COUNTER MEDICATION Take 1 capsule by mouth daily. NUGENIX      sitaGLIPtin (JANUVIA) 100 MG tablet Take 1 tablet (100 mg total) by mouth daily. 90 tablet 1   VITAMIN D PO Take by mouth.     [DISCONTINUED] linagliptin (TRADJENTA) 5 MG TABS tablet Take 1 tablet (5 mg total) by mouth daily. 30 tablet 3   No current facility-administered medications on file prior to visit.    BP 120/75 (BP Location: Left Arm, Patient Position: Sitting, Cuff Size: Normal)   Pulse 78   Temp 97.8 F (36.6 C) (Temporal)   Resp 18   Ht 5\' 9"  (1.753 m)   Wt 187 lb 12.8 oz (85.2 kg)   SpO2 99%   BMI 27.73 kg/m        Objective:   Physical Exam  General Mental Status- Alert. General Appearance- Not in acute distress.   Skin General: Color- Normal Color.  Moisture- Normal Moisture.  Neck Carotid Arteries- Normal color. Moisture- Normal Moisture. No carotid bruits. No JVD.  Chest and Lung Exam Auscultation: Breath Sounds:-Normal.  Cardiovascular Auscultation:Rythm- Regular. Murmurs & Other Heart Sounds:Auscultation of the heart reveals- No Murmurs.  Abdomen Inspection:-Inspeection Normal. Palpation/Percussion:Note:No mass. Palpation and Percussion of the abdomen reveal- Non Tender, Non Distended + BS, no rebound or guarding.  Neurologic Cranial Nerve exam:- CN III-XII intact(No nystagmus), symmetric smile. Strength:- 5/5 equal and symmetric strength both upper and lower extremities.   Lower ext- see quality metrics.      Assessment & Plan:   Patient Instructions  1. Screening for colon cancer - Ambulatory referral to Gastroenterology  2. Controlled type 2 diabetes mellitus without complication, without long-term current use of insulin (HCC) Continue jardiance, exercise and low sugar diet. - Urine Microalbumin w/creat. ratio; Future - Hemoglobin A1c; Future - Comp Met (CMET); Future - Ambulatory referral to Optometry  3. Diabetic eye exam (HCC) - Ambulatory referral to Optometry   4- hyperlipidemia. -future fasting lipid panel.  Follow up date to be determined after lab review.     [name], PA-C

## 2022-11-09 NOTE — Patient Instructions (Addendum)
1. Screening for colon cancer - Ambulatory referral to Gastroenterology(please call the GI office)  2. Controlled type 2 diabetes mellitus without complication, without long-term current use of insulin (HCC) Continue jardiance, exercise and low sugar diet. - Urine Microalbumin w/creat. ratio; Future - Hemoglobin A1c; Future - Comp Met (CMET); Future - Ambulatory referral to Optometry  3. Diabetic eye exam (HCC) - Ambulatory referral to Optometry   4- hyperlipidemia. -future fasting lipid panel.  Follow up date to be determined after lab review.

## 2022-11-23 ENCOUNTER — Other Ambulatory Visit: Payer: BC Managed Care – PPO

## 2022-11-26 ENCOUNTER — Other Ambulatory Visit: Payer: Self-pay | Admitting: Medical

## 2022-12-07 ENCOUNTER — Telehealth: Payer: Self-pay | Admitting: Medical

## 2022-12-07 MED ORDER — EMPAGLIFLOZIN 25 MG PO TABS: 25.0000 mg | ORAL_TABLET | Freq: Every day | ORAL | 0 refills | Status: DC

## 2022-12-07 NOTE — Telephone Encounter (Signed)
Pt.notified

## 2022-12-07 NOTE — Telephone Encounter (Signed)
90 day supply sent in 

## 2022-12-07 NOTE — Telephone Encounter (Signed)
Pt called and stated that his prescription for JARDIANCE 25 MG TABS tablet was sent in as a 30 day supply but it needs to be 90 day. He mentioned that he pays more for the 30 day. Please correct and advise pt.

## 2022-12-07 NOTE — Addendum Note (Signed)
Addended by: Maximino Sarin on: 12/07/2022 11:02 AM   Modules accepted: Orders

## 2022-12-09 ENCOUNTER — Emergency Department (HOSPITAL_COMMUNITY)
Admission: EM | Admit: 2022-12-09 | Discharge: 2022-12-09 | Disposition: A | Discharge status: Home / Self Care | Payer: BC Managed Care – PPO | Attending: Emergency Medicine | Admitting: Emergency Medicine

## 2022-12-09 ENCOUNTER — Other Ambulatory Visit: Payer: Self-pay

## 2022-12-09 DIAGNOSIS — Z7951 Long term (current) use of inhaled steroids: Secondary | ICD-10-CM | POA: Diagnosis not present

## 2022-12-09 DIAGNOSIS — Z7984 Long term (current) use of oral hypoglycemic drugs: Secondary | ICD-10-CM | POA: Insufficient documentation

## 2022-12-09 DIAGNOSIS — S61411A Laceration without foreign body of right hand, initial encounter: Secondary | ICD-10-CM | POA: Diagnosis not present

## 2022-12-09 DIAGNOSIS — W540XXA Bitten by dog, initial encounter: Secondary | ICD-10-CM | POA: Diagnosis not present

## 2022-12-09 DIAGNOSIS — Z794 Long term (current) use of insulin: Secondary | ICD-10-CM | POA: Diagnosis not present

## 2022-12-09 DIAGNOSIS — Z23 Encounter for immunization: Secondary | ICD-10-CM | POA: Insufficient documentation

## 2022-12-09 DIAGNOSIS — J45909 Unspecified asthma, uncomplicated: Secondary | ICD-10-CM | POA: Insufficient documentation

## 2022-12-09 DIAGNOSIS — S61051A Open bite of right thumb without damage to nail, initial encounter: Secondary | ICD-10-CM | POA: Diagnosis not present

## 2022-12-09 DIAGNOSIS — E119 Type 2 diabetes mellitus without complications: Secondary | ICD-10-CM | POA: Insufficient documentation

## 2022-12-09 DIAGNOSIS — S61011A Laceration without foreign body of right thumb without damage to nail, initial encounter: Secondary | ICD-10-CM | POA: Diagnosis not present

## 2022-12-09 DIAGNOSIS — S61451A Open bite of right hand, initial encounter: Secondary | ICD-10-CM | POA: Diagnosis not present

## 2022-12-09 MED ORDER — TETANUS-DIPHTH-ACELL PERTUSSIS 5-2.5-18.5 LF-MCG/0.5 IM SUSY
0.5000 mL | PREFILLED_SYRINGE | Freq: Once | INTRAMUSCULAR | Status: AC
  Administered 2022-12-09: 0.5 mL via INTRAMUSCULAR
  Filled 2022-12-09: qty 0.5

## 2022-12-09 MED ORDER — AMOXICILLIN-POT CLAVULANATE 875-125 MG PO TABS
1.0000 | ORAL_TABLET | Freq: Once | ORAL | Status: AC
  Administered 2022-12-09: 1 via ORAL
  Filled 2022-12-09: qty 1

## 2022-12-09 MED ORDER — LIDOCAINE HCL 2 % IJ SOLN
10.0000 mL | Freq: Once | INTRAMUSCULAR | Status: AC
  Administered 2022-12-09: 200 mg
  Filled 2022-12-09: qty 20

## 2022-12-09 MED ORDER — AMOXICILLIN-POT CLAVULANATE 875-125 MG PO TABS: 1.0000 | ORAL_TABLET | Freq: Two times a day (BID) | ORAL | 0 refills | Status: DC

## 2022-12-09 NOTE — ED Provider Notes (Signed)
[place] EMERGENCY DEPARTMENT AT [facility] Provider Note   CSN: [id number] Arrival date & time: 12/09/22  1936     History  Chief Complaint  Patient presents with   Animal Bite    Ethan Holt is a 62 y.o. male with history of diabetes, asthma, who presents the emergency department complaining of multiple dog bites.  Patient states that his dog bit him several times about 30 minutes prior to ER arrival.  He sustained bites to his right hand, arm, and right upper chest.  He states that he was playing with the dog when the dog started getting very aggressive, and attacked him.  He states that the dog had had some issues with aggression in the past as well.  States the dog's rabies vaccinations are up-to-date.  He has not contacted animal control.  Does not think his tetanus vaccination is up-to-date.   Animal Bite      Home Medications Prior to Admission medications   Medication Sig Start Date End Date Taking? Authorizing Provider  amoxicillin-clavulanate (AUGMENTIN) 875-125 MG tablet Take 1 tablet by mouth every 12 (twelve) hours. 12/09/22  Yes Deborha Moseley T, PA-C  atorvastatin (LIPITOR) 10 MG tablet TAKE 1 TABLET BY MOUTH EVERY DAY 08/16/22   Saguier, Ramon Dredge, PA-C  Continuous Blood Gluc Sensor (FREESTYLE LIBRE 2 SENSOR) MISC 1 Device by Does not apply route every 14 (fourteen) days. 04/11/21   Shamleffer, Konrad Dolores, MD  Cyanocobalamin (VITAMIN B-12 PO) Take 2 tablets by mouth daily.     [provider]  empagliflozin (JARDIANCE) 25 MG TABS tablet Take 1 tablet (25 mg total) by mouth daily before breakfast. 12/07/22 03/07/23  Saguier, Ramon Dredge, PA-C  fluticasone (FLONASE) 50 MCG/ACT nasal spray Place 2 sprays into both nostrils daily. 03/05/22   Saguier, Ramon Dredge, PA-C  ibuprofen (ADVIL) 800 MG tablet Take 1 tablet (800 mg total) by mouth every 8 (eight) hours as needed. 03/05/22   Saguier, Ramon Dredge, PA-C  ibuprofen (ADVIL,MOTRIN) 800 MG tablet Take 1 tablet (800  mg total) by mouth 3 (three) times daily. 11/10/13   Tomasita Crumble, MD  Insulin Pen Needle 32G X 4 MM MISC 1 Device by Does not apply route daily. 04/11/21   Shamleffer, Konrad Dolores, MD  Multiple Vitamin (MULTIVITAMIN WITH MINERALS) TABS Take 1 tablet by mouth daily.    [provider]  Multiple Vitamins-Minerals (ZINC PO) Take by mouth.    [provider]  OVER THE COUNTER MEDICATION Take 1 capsule by mouth daily. NUGENIX     [provider]  sitaGLIPtin (JANUVIA) 100 MG tablet Take 1 tablet (100 mg total) by mouth daily. 03/08/21   Saguier, Ramon Dredge, PA-C  VITAMIN D PO Take by mouth.    [provider]  linagliptin (TRADJENTA) 5 MG TABS tablet Take 1 tablet (5 mg total) by mouth daily. 07/29/19 07/29/19  Saguier, Ramon Dredge, PA-C      Allergies    Patient has no known allergies.    Review of Systems   Review of Systems  Skin:  Positive for wound.  All other systems reviewed and are negative.   Physical Exam Updated Vital Signs BP (!) 143/97 (BP Location: Left Arm)   Pulse (!) 106   Temp 97.8 F (36.6 C) (Oral)   Resp 18   SpO2 100%  Physical Exam Vitals and nursing note reviewed.  Constitutional:      Appearance: Normal appearance.  HENT:     Head: Normocephalic and atraumatic.  Eyes:  Conjunctiva/sclera: Conjunctivae normal.  Pulmonary:     Effort: Pulmonary effort is normal. No respiratory distress.  Skin:    General: Skin is warm and dry.     Comments: Deep laceration about 1.5 cm to palmar right thumb, laceration about 1 cm to dorsal right thumb. Ongoing bleeding. Overlying abrasions and skin tears to dorsal right forearm, ventral forearm, and right chest.   Neurological:     Mental Status: He is alert.  Psychiatric:        Mood and Affect: Mood normal.        Behavior: Behavior normal.     ED Results / Procedures / Treatments   Labs (all labs ordered are listed, but only abnormal results are displayed) Labs Reviewed - No data to  display  EKG None  Radiology No results found.  Procedures .Marland KitchenLaceration Repair  Date/Time: 12/09/2022 10:18 PM  Performed by: Su Monks, PA-C Authorized by: Su Monks, PA-C   Consent:    Consent obtained:  Verbal   Consent given by:  Patient   Risks, benefits, and alternatives were discussed: yes     Risks discussed:  Infection, pain, poor cosmetic result and poor wound healing   Alternatives discussed:  No treatment and observation Universal protocol:    Patient identity confirmed:  Provided demographic data Anesthesia:    Anesthesia method:  Nerve block   Block location:  Right 1st digital   Block needle gauge:  25 G   Block anesthetic:  Lidocaine 2% w/o epi   Block technique:  Transthecal, modified transthecal   Block injection procedure:  Anatomic landmarks palpated   Block outcome:  Incomplete block Laceration details:    Location:  Finger   Finger location:  R thumb   Length (cm):  2 (additional laceration 1 cm)   Depth (mm):  2 Pre-procedure details:    Preparation:  Patient was prepped and draped in usual sterile fashion Exploration:    Limited defect created (wound extended): no     Hemostasis achieved with:  Direct pressure Treatment:    Area cleansed with:  Saline and Shur-Clens   Amount of cleaning:  Standard   Irrigation solution:  Sterile saline   Irrigation volume:  250 cc   Irrigation method:  Pressure wash   Visualized foreign bodies/material removed: no     Debridement:  None   Undermining:  None   Scar revision: no   Skin repair:    Repair method:  Sutures   Suture size:  5-0   Suture material:  Prolene   Suture technique:  Simple interrupted   Number of sutures:  4 Approximation:    Approximation:  Loose Repair type:    Repair type:  Simple Post-procedure details:    Dressing:  Non-adherent dressing   Procedure completion:  Tolerated well, no immediate complications     Medications Ordered in ED Medications   Tdap (BOOSTRIX) injection 0.5 mL (0.5 mLs Intramuscular Given 12/09/22 2128)  lidocaine (XYLOCAINE) 2 % (with pres) injection 200 mg (200 mg Infiltration Given 12/09/22 2129)  amoxicillin-clavulanate (AUGMENTIN) 875-125 MG per tablet 1 tablet (1 tablet Oral Given 12/09/22 2128)    ED Course/ Medical Decision Making/ A&P                                 Medical Decision Making Risk Prescription drug management.  Patient presents with wound from dog bite, which occurred <1 hrs  prior to ER arrival. Animal up to date on vaccinations. Tdap is out of date.   Physical examination: Wounds examined with visualization of the base and no foreign bodies seen.  Pt Alert and oriented, NAD, nontoxic, nonseptic appearing.  Capillary refill intact and pt without neurologic deficit.    Procedures: Wounds thoroughly cleaned and repaired with sutures. Felt wounds to thumb were too gaping and would not heal appropriately without repair, bleeding improved after loose sutures.   Medications: Updated tdap, given initial dose of augmentin.   Disposition: Patient not requiring admission or inpatient treatment. Tdap updated. Wounds cleaned and dressed appropriately. We'll discharge home with Augmentin and give close return precautions.   Final Clinical Impression(s) / ED Diagnoses Final diagnoses:  Dog bite, initial encounter    Rx / DC Orders ED Discharge Orders          Ordered    amoxicillin-clavulanate (AUGMENTIN) 875-125 MG tablet  Every 12 hours        12/09/22 2212           Portions of this report may have been transcribed using voice recognition software. Every effort was made to ensure accuracy; however, inadvertent computerized transcription errors may be present.    Jeanella Flattery 12/09/22 2221    Vanetta Mulders, MD 12/09/22 2310

## 2022-12-09 NOTE — ED Triage Notes (Signed)
Pt arrrives via POV after a dog bites from his dog a [place] Bullie approx 30 min PTA. Sustained bites to right arm and right upper chest. Bleeding controlled on arrival. Rabies Vaccines up to date. Has not contacted animal control.

## 2022-12-09 NOTE — Discharge Instructions (Addendum)
You were seen in the emergency department for dog bite.   We have closed your laceration(s) with sutures. These need to be removed in 7 days. This can be done at any doctor's office, urgent care, or emergency department.   If any of the sutures come out before it is time for removal, that is okay. Make sure to keep the area as clean and dry as possible. You can let warm soapy warm run over the area, but do NOT scrub it.   Watch out for signs of infection, like we discussed, including: increased redness, tenderness, or drainage of pus from the area.   You can take over the counter pain medicine like ibuprofen or tylenol as needed. I have prescribed you antibiotics, it is important you complete the entire course. We have also updated your tetanus vaccination.

## 2022-12-10 ENCOUNTER — Other Ambulatory Visit: Payer: BC Managed Care – PPO

## 2023-01-08 ENCOUNTER — Telehealth: Payer: Self-pay

## 2023-01-08 DIAGNOSIS — R350 Frequency of micturition: Secondary | ICD-10-CM | POA: Diagnosis not present

## 2023-01-08 DIAGNOSIS — N644 Mastodynia: Secondary | ICD-10-CM | POA: Diagnosis not present

## 2023-01-08 DIAGNOSIS — N761 Subacute and chronic vaginitis: Secondary | ICD-10-CM | POA: Diagnosis not present

## 2023-01-08 DIAGNOSIS — M542 Cervicalgia: Secondary | ICD-10-CM | POA: Diagnosis not present

## 2023-01-08 NOTE — Telephone Encounter (Signed)
Transition Care Management Unsuccessful Follow-up Telephone Call  Date of discharge and from where:  12/27/2022 [facility]  Attempts:  1st Attempt  Reason for unsuccessful TCM follow-up call:  Left voice message  Jaciel Diem [facility]  [facility], [facility] Resource Care Guide Direct Dial: [phone]  Website: [email or web address]

## 2023-01-09 ENCOUNTER — Telehealth: Payer: Self-pay

## 2023-01-09 NOTE — Telephone Encounter (Signed)
Transition Care Management Unsuccessful Follow-up Telephone Call  Date of discharge and from where:  12/09/2022 [facility]  Attempts:  2nd Attempt  Reason for unsuccessful TCM follow-up call:  Voice mail full  Winifred Balogh [facility]  [facility], [facility] Resource Care Guide Direct Dial: [phone]  Website: [email or web address]

## 2023-01-15 DIAGNOSIS — N644 Mastodynia: Secondary | ICD-10-CM | POA: Diagnosis not present

## 2023-02-22 ENCOUNTER — Ambulatory Visit: Payer: BC Managed Care – PPO | Admitting: Medical

## 2023-03-01 ENCOUNTER — Ambulatory Visit: Payer: BC Managed Care – PPO | Admitting: Medical

## 2023-03-04 ENCOUNTER — Ambulatory Visit (INDEPENDENT_AMBULATORY_CARE_PROVIDER_SITE_OTHER): Payer: BC Managed Care – PPO | Admitting: Medical

## 2023-03-04 VITALS — BP 130/82 | HR 85 | Resp 18 | Ht 69.0 in | Wt 195.8 lb

## 2023-03-04 DIAGNOSIS — R0981 Nasal congestion: Secondary | ICD-10-CM

## 2023-03-04 DIAGNOSIS — M25511 Pain in right shoulder: Secondary | ICD-10-CM

## 2023-03-04 DIAGNOSIS — E119 Type 2 diabetes mellitus without complications: Secondary | ICD-10-CM

## 2023-03-04 DIAGNOSIS — Z1211 Encounter for screening for malignant neoplasm of colon: Secondary | ICD-10-CM

## 2023-03-04 DIAGNOSIS — J069 Acute upper respiratory infection, unspecified: Secondary | ICD-10-CM

## 2023-03-04 DIAGNOSIS — E785 Hyperlipidemia, unspecified: Secondary | ICD-10-CM

## 2023-03-04 DIAGNOSIS — G8929 Other chronic pain: Secondary | ICD-10-CM

## 2023-03-04 MED ORDER — AMOXICILLIN-POT CLAVULANATE 875-125 MG PO TABS: 1.0000 | ORAL_TABLET | Freq: Two times a day (BID) | ORAL | 0 refills | Status: DC

## 2023-03-04 NOTE — Patient Instructions (Addendum)
 Chronic Shoulder Pain History of labral tear with degenerative changes. Pain exacerbated by lying on the affected side. Current management with intermittent ibuprofen  800mg  and use of a vibrating brace provides relief. -Continue current management. -Referral to Sports Medicine for further evaluation and management.  Upper Respiratory Symptoms Symptoms suggestive of a common cold or early sinusitis. No colored mucus or cough. Currently using Flonase . -Continue Flonase . -Start Mucinex . -Benzonatate  available for potential cough. -Consider starting Augmentin  if symptoms worsen by end of the week. -Consider saline nasal irrigation twice daily.  Diabetes Mellitus Labs due for A1C, lipid panel, Metabox. panel, and urine protein. -Draw labs today.  Colon Cancer Screening Previous attempt to schedule colonoscopy unsuccessful due to patient being out of town. -Reschedule colonoscopy.  Diabetic Eye Exam Not yet completed. -Provide information for optometrist on after visit summary for patient to schedule exam.  .Follow up date to be determined after lab review. As needed for URI/sinus complaints and shoulder as well.

## 2023-03-04 NOTE — Progress Notes (Signed)
 Subjective:    Patient ID: Ethan Holt, male    DOB: 05/10/1960, 63 y.o.   MRN: [id number]  HPI Pt in for follow up.  Last visit AVS below in   1. Screening for colon cancer - Ambulatory referral to Gastroenterology   2. Controlled type 2 diabetes mellitus without complication, without long-term current use of insulin  (HCC) Continue jardiance , exercise and low sugar diet. - Urine Microalbumin w/creat. ratio; Future - Hemoglobin A1c; Future - Comp Met (CMET); Future - Ambulatory referral to Optometry   3. Diabetic eye exam (HCC) - Ambulatory referral to Optometry    4- hyperlipidemia. -future fasting lipid panel.    Today Discussed the use of AI scribe software for clinical note transcription with the patient, who gave verbal consent to proceed.  History of Present Illness   The patient, with a history of diabetes and chronic shoulder pain, presents with recent onset of sinus pressure and congestion, described as a headache-like sensation. The symptoms started approximately a week ago, with intermittent fevers and sweats. The patient denies any colored nasal discharge but reports a clear, watery nasal discharge on one occasion. He has been managing the symptoms with sporadic use of Flonase , which he reports as being effective when used infrequently.  The patient also reports a recurrence of chronic shoulder pain, which has been diagnosed as a labral tear with degenerative changes. The pain is intermittent, exacerbated by lying on the affected side, and relieved by the use of an 800mg  ibuprofen  approximately every three to four days. The patient also uses a vibrating brace, which he reports as providing significant relief.  The patient's diabetes management includes the use of Jardiance , diet control, and exercise. He reports missing a scheduled diabetic eye exam. He also mentions a pending colon cancer screening, which was previously scheduled but missed due to the patient  being out of town.       Review of Systems  Constitutional:  Negative for chills, fatigue and fever.  HENT:  Positive for congestion, sinus pressure and sinus pain.   Respiratory:  Negative for cough, chest tightness, wheezing and stridor.   Cardiovascular:  Negative for chest pain and palpitations.  Gastrointestinal:  Negative for abdominal pain, constipation and nausea.  Genitourinary:  Negative for dysuria.  Musculoskeletal:  Negative for back pain and joint swelling.  Neurological:  Negative for facial asymmetry, speech difficulty and light-headedness.  Hematological:  Negative for adenopathy. Does not bruise/bleed easily.  Psychiatric/Behavioral:  Negative for confusion.    Past Medical History:  Diagnosis Date   Allergy    spring   Asthma    as child    Diabetes mellitus without complication (HCC)      Social History   Socioeconomic History   Marital status: Married    Spouse name: Not on file   Number of children: Not on file   Years of education: Not on file   Highest education level: Not on file  Occupational History   Not on file  Tobacco Use   Smoking status: Never   Smokeless tobacco: Never  Substance and Sexual Activity   Alcohol use: No   Drug use: No   Sexual activity: Not on file  Other Topics Concern   Not on file  Social History Narrative   Not on file   Social Drivers of Health   Financial Resource Strain: Not on file  Food Insecurity: Not on file  Transportation Needs: Not on file  Physical Activity: Not on file  Stress: Not on file  Social Connections: Not on file  Intimate Partner Violence: Not on file    No past surgical history on file.  No family history on file.  No Known Allergies  Current Outpatient Medications on File Prior to Visit  Medication Sig Dispense Refill   atorvastatin  (LIPITOR) 10 MG tablet TAKE 1 TABLET BY MOUTH EVERY DAY 90 tablet 1   Continuous Blood Gluc Sensor (FREESTYLE LIBRE 2 SENSOR) MISC 1 Device by  Does not apply route every 14 (fourteen) days. 6 each 3   Cyanocobalamin  (VITAMIN B-12 PO) Take 2 tablets by mouth daily.      empagliflozin  (JARDIANCE ) 25 MG TABS tablet Take 1 tablet (25 mg total) by mouth daily before breakfast. 90 tablet 0   fluticasone  (FLONASE ) 50 MCG/ACT nasal spray Place 2 sprays into both nostrils daily. 16 g 1   ibuprofen  (ADVIL ) 800 MG tablet Take 1 tablet (800 mg total) by mouth every 8 (eight) hours as needed. 30 tablet 0   ibuprofen  (ADVIL ,MOTRIN ) 800 MG tablet Take 1 tablet (800 mg total) by mouth 3 (three) times daily. 21 tablet 0   Insulin  Pen Needle 32G X 4 MM MISC 1 Device by Does not apply route daily. 100 each 3   Multiple Vitamin (MULTIVITAMIN WITH MINERALS) TABS Take 1 tablet by mouth daily.     Multiple Vitamins-Minerals (ZINC PO) Take by mouth.     OVER THE COUNTER MEDICATION Take 1 capsule by mouth daily. NUGENIX      sitaGLIPtin  (JANUVIA ) 100 MG tablet Take 1 tablet (100 mg total) by mouth daily. 90 tablet 1   VITAMIN D  PO Take by mouth.     [DISCONTINUED] linagliptin  (TRADJENTA ) 5 MG TABS tablet Take 1 tablet (5 mg total) by mouth daily. 30 tablet 3   No current facility-administered medications on file prior to visit.    BP 130/82   Pulse 85   Resp 18   Ht 5' 9 (1.753 m)   Wt 195 lb 12.8 oz (88.8 kg)   SpO2 99%   BMI 28.91 kg/m        Objective:   Physical Exam  General Mental Status- Alert. General Appearance- Not in acute distress.   Skin General: Color- Normal Color. Moisture- Normal Moisture.  Neck Carotid Arteries- Normal color. Moisture- Normal Moisture. No carotid bruits. No JVD.  Chest and Lung Exam Auscultation: Breath Sounds:-Normal.  Cardiovascular Auscultation:Rythm- Regular. Murmurs & Other Heart Sounds:Auscultation of the heart reveals- No Murmurs.  Abdomen Inspection:-Inspeection Normal. Palpation/Percussion:Note:No mass. Palpation and Percussion of the abdomen reveal- Non Tender, Non Distended + BS, no  rebound or guarding.    Neurologic Cranial Nerve exam:- CN III-XII intact(No nystagmus), symmetric smile. Strength:- 5/5 equal and symmetric strength both upper and lower extremities.   Heent- mild nasal congestion. Faint frontal , maxilary and ethmoid sinus pressure to palpation.  Rt shouler- on rom today report no obvious pain.    Assessment & Plan:   Assessment and Plan    Chronic Shoulder Pain History of labral tear with degenerative changes. Pain exacerbated by lying on the affected side. Current management with intermittent ibuprofen  800mg  and use of a vibrating brace provides relief. -Continue current management. -Referral to Sports Medicine for further evaluation and management.  Upper Respiratory Symptoms Symptoms suggestive of a common cold or early sinusitis. No colored mucus or cough. Currently using Flonase . -Continue Flonase . -Start Mucinex . -Benzonatate  available for potential cough. -Consider starting Augmentin  if symptoms worsen by end of the week. -  Consider saline nasal irrigation twice daily.  Diabetes Mellitus Labs due for A1C, lipid panel, Metabox. panel, and urine protein. -Draw labs today.  Colon Cancer Screening Previous attempt to schedule colonoscopy unsuccessful due to patient being out of town. -Reschedule colonoscopy.  Diabetic Eye Exam Not yet completed. -Provide information for optometrist on after visit summary for patient to schedule exam.      [facility] [address], [place], [place] [address] Hours:  Open ? Closes 5?PM Phone: [phone]  Follow up date to be determined after lab review. As needed for URI/sinus complaints and shoulder as well.

## 2023-03-05 LAB — COMPREHENSIVE METABOLIC PANEL
ALT: 33 U/L (ref 0–53)
AST: 18 U/L (ref 0–37)
Albumin: 4.3 g/dL (ref 3.5–5.2)
Alkaline Phosphatase: 68 U/L (ref 39–117)
BUN: 16 mg/dL (ref 6–23)
CO2: 28 meq/L (ref 19–32)
Calcium: 9.5 mg/dL (ref 8.4–10.5)
Chloride: 104 meq/L (ref 96–112)
Creatinine, Ser: 0.98 mg/dL (ref 0.40–1.50)
GFR: 81.75 mL/min (ref >60.00)
Glucose, Bld: 114 mg/dL — ABNORMAL HIGH (ref 70–99)
Potassium: 4 meq/L (ref 3.5–5.1)
Sodium: 140 meq/L (ref 135–145)
Total Bilirubin: 0.7 mg/dL (ref 0.2–1.2)
Total Protein: 6.8 g/dL (ref 6.0–8.3)

## 2023-03-05 LAB — LIPID PANEL
Cholesterol: 150 mg/dL (ref 0–200)
HDL: 46.4 mg/dL (ref >39.00)
LDL Cholesterol: 93 mg/dL (ref 0–99)
NonHDL: 103.39
Total CHOL/HDL Ratio: 3
Triglycerides: 50 mg/dL (ref 0.0–149.0)
VLDL: 10 mg/dL (ref 0.0–40.0)

## 2023-03-05 LAB — MICROALBUMIN / CREATININE URINE RATIO
Creatinine,U: 247.2 mg/dL
Microalb Creat Ratio: 2.4 mg/g (ref 0.0–30.0)
Microalb, Ur: 5.9 mg/dL — ABNORMAL HIGH (ref 0.0–1.9)

## 2023-03-05 LAB — HEMOGLOBIN A1C: Hgb A1c MFr Bld: 10 % — ABNORMAL HIGH (ref 4.6–6.5)

## 2023-03-07 ENCOUNTER — Other Ambulatory Visit: Payer: Self-pay | Admitting: Medical

## 2023-03-07 MED ORDER — EMPAGLIFLOZIN 25 MG PO TABS: 25.0000 mg | ORAL_TABLET | Freq: Every day | ORAL | 0 refills | Status: DC

## 2023-03-07 NOTE — Telephone Encounter (Signed)
 Copied from CRM [id number]. Topic: Clinical - Medication Refill >> Mar 07, 2023 11:36 AM Alfonso ORN wrote: Most Recent Primary Care Visit:  Provider: DORINA LOVING  Department: [facility]  Visit Type: OFFICE VISIT  Date: 11/09/2022  Medication: empagliflozin  (JARDIANCE ) 25 MG TABS tablet  Has the patient contacted their pharmacy? Yes , stated pharmacy provider have not put in yet   (Agent: If no, request that the patient contact the pharmacy for the refill. If patient does not wish to contact the pharmacy document the reason why and proceed with request.) (Agent: If yes, when and what did the pharmacy advise?)  Is this the correct pharmacy for this prescription? Yes  If no, delete pharmacy and type the correct one.  This is the patient's preferred pharmacy:  [id number] [place], [place] - [address] [place] [address] Phone: [phone] Fax: [phone]   Has the prescription been filled recently?   Is the patient out of the medication? Yes   Has the patient been seen for an appointment in the last year OR does the patient have an upcoming appointment? Yes   Can we respond through MyChart? Yes   Agent: Please be advised that Rx refills may take up to 3 business days. We ask that you follow-up with your pharmacy.

## 2023-03-08 ENCOUNTER — Other Ambulatory Visit: Payer: Self-pay | Admitting: Medical

## 2023-04-01 ENCOUNTER — Telehealth: Payer: Self-pay

## 2023-04-01 ENCOUNTER — Ambulatory Visit (AMBULATORY_SURGERY_CENTER): Payer: BC Managed Care – PPO

## 2023-04-01 VITALS — Ht 69.0 in | Wt 187.0 lb

## 2023-04-01 DIAGNOSIS — J069 Acute upper respiratory infection, unspecified: Secondary | ICD-10-CM | POA: Diagnosis not present

## 2023-04-01 DIAGNOSIS — Z1211 Encounter for screening for malignant neoplasm of colon: Secondary | ICD-10-CM

## 2023-04-01 DIAGNOSIS — K648 Other hemorrhoids: Secondary | ICD-10-CM | POA: Diagnosis not present

## 2023-04-01 MED ORDER — SUFLAVE 178.7 G PO SOLR
1.0000 | ORAL | 0 refills | Status: DC
Start: 2023-04-01 — End: 2023-04-25

## 2023-04-01 NOTE — Progress Notes (Signed)

## 2023-04-01 NOTE — Telephone Encounter (Signed)
 PV in progress

## 2023-04-01 NOTE — Telephone Encounter (Signed)
Copied from CRM [id number]. Topic: General - Other >> Apr 01, 2023 10:56 AM Kathryne Eriksson wrote: Reason for CRM: Requesting A Call Back >> Apr 01, 2023 11:00 AM Kathryne Eriksson wrote: Patient is requesting a call back at [phone]. States he wants to speak with Saguier,Edward PA nurse, but wouldn't give specific details in regards to what.

## 2023-04-01 NOTE — Telephone Encounter (Signed)
Pt wants to know if he is supposed to be taking basaglar even though he is taking jardiance   119 - fasting

## 2023-04-02 ENCOUNTER — Other Ambulatory Visit: Payer: Self-pay | Admitting: Medical

## 2023-04-02 ENCOUNTER — Other Ambulatory Visit (HOSPITAL_BASED_OUTPATIENT_CLINIC_OR_DEPARTMENT_OTHER): Payer: Self-pay

## 2023-04-02 MED ORDER — SITAGLIPTIN PHOSPHATE 100 MG PO TABS
100.0000 mg | ORAL_TABLET | Freq: Every day | ORAL | 3 refills | Status: DC
  Filled 2023-04-02: qty 90, 90d supply, fill #0

## 2023-04-02 NOTE — Telephone Encounter (Signed)
 I did talk with patient today and we discussed his last A1c of 10.  I had wanted him to follow-up January 21 but he states that he did not see the message and he was out of town.  He states that he has been eating better since mid January.  Recently he gives me 3  BS readings 2 fasting in the morning  119 and 120 and then while we were on the phone he checked his sugar reviewed and his sugar was 120.  This reading was about 3 hours after eating.  Based on these limited readings I want him to continue Jardiance  and will add back Januvia  which she had been in the past without side effect.  On review he states he had side effects with metformin .  Going forward I will recheck his blood sugar twice daily fasting and 1 postmeal.  I want him to notify me if his blood sugars are exceeding 200. Want him to follow up with me late march past the 10 th or sooner if sugars post meal spiking over 200.  Please get him scheduled.

## 2023-04-02 NOTE — Addendum Note (Signed)
Addended by: Gwenevere Abbot on: 04/02/2023 05:33 PM   Modules accepted: Orders

## 2023-04-03 ENCOUNTER — Other Ambulatory Visit (HOSPITAL_BASED_OUTPATIENT_CLINIC_OR_DEPARTMENT_OTHER): Payer: Self-pay

## 2023-04-03 ENCOUNTER — Other Ambulatory Visit: Payer: Self-pay

## 2023-04-03 MED ORDER — SITAGLIPTIN PHOSPHATE 100 MG PO TABS: 100.0000 mg | ORAL_TABLET | Freq: Every day | ORAL | 3 refills | Status: DC

## 2023-04-03 MED ORDER — SITAGLIPTIN PHOSPHATE 100 MG PO TABS
100.0000 mg | ORAL_TABLET | Freq: Every day | ORAL | 3 refills | Status: DC
  Filled 2023-04-03: qty 90, 90d supply, fill #0

## 2023-04-03 NOTE — Telephone Encounter (Signed)
 Patient scheduled for 05/06/23.  He wanted to do appointment sooner rather than later.

## 2023-04-04 ENCOUNTER — Other Ambulatory Visit (HOSPITAL_BASED_OUTPATIENT_CLINIC_OR_DEPARTMENT_OTHER): Payer: Self-pay

## 2023-04-04 ENCOUNTER — Telehealth: Payer: Self-pay

## 2023-04-04 NOTE — Telephone Encounter (Signed)
 Copied from CRM [id number]. Topic: Clinical - Prescription Issue >> Apr 04, 2023  2:38 PM Chantha C wrote: Reason for CRM: Patient want to let [name] know, Januvia  is not covered by insurance and a PA is needed. Please call back at [phone]. [id number] [place], [place] - [address] [place] [address] [phone]:[phone]

## 2023-04-05 NOTE — Telephone Encounter (Signed)
 Pt notified to call around to different pharmacies to see if they have medication and stated he'd call back

## 2023-04-05 NOTE — Telephone Encounter (Signed)
 [facility] & [facility] stated [facility] dont [facility] the generic brand yet

## 2023-04-05 NOTE — Telephone Encounter (Signed)
 Ins covers generic only now that Januvia  has gone generic this year. I sent a prescription w/ a note for the pharmacy to fill only generic on 04/03/23

## 2023-04-05 NOTE — Telephone Encounter (Signed)
 Can you do a PA for this medication

## 2023-04-05 NOTE — Telephone Encounter (Signed)
 Pt called and he stated pharmacy told him a PA would be needed and asked for a coupoun made him aware pharmacy should've filled generic brand per prescription note and he stated he wasn't sure on brand , will pharmacy once they open and call pt back

## 2023-04-10 ENCOUNTER — Telehealth: Payer: Self-pay | Admitting: *Deleted

## 2023-04-10 ENCOUNTER — Telehealth: Payer: Self-pay

## 2023-04-10 DIAGNOSIS — D509 Iron deficiency anemia, unspecified: Secondary | ICD-10-CM | POA: Diagnosis not present

## 2023-04-10 DIAGNOSIS — E559 Vitamin D deficiency, unspecified: Secondary | ICD-10-CM | POA: Diagnosis not present

## 2023-04-10 DIAGNOSIS — K219 Gastro-esophageal reflux disease without esophagitis: Secondary | ICD-10-CM | POA: Diagnosis not present

## 2023-04-10 DIAGNOSIS — F419 Anxiety disorder, unspecified: Secondary | ICD-10-CM | POA: Diagnosis not present

## 2023-04-10 DIAGNOSIS — E7800 Pure hypercholesterolemia, unspecified: Secondary | ICD-10-CM | POA: Diagnosis not present

## 2023-04-10 DIAGNOSIS — Z79899 Other long term (current) drug therapy: Secondary | ICD-10-CM | POA: Diagnosis not present

## 2023-04-10 DIAGNOSIS — E119 Type 2 diabetes mellitus without complications: Secondary | ICD-10-CM | POA: Diagnosis not present

## 2023-04-10 NOTE — Telephone Encounter (Signed)
Patient was identified as falling into the [place] Measure - Diabetes.   Patient was: Appointment scheduled with primary care provider in the next 30 days.

## 2023-04-10 NOTE — Telephone Encounter (Signed)
PA initiated via Covermymeds; KEY: [id number]. Awaiting determination.

## 2023-04-10 NOTE — Telephone Encounter (Signed)
PA denied. Plan requires Pt to use generic sitagliptin. However he has not been able to find it at a local pharmacy yet. You will have to change to a different medicine for now.

## 2023-04-10 NOTE — Telephone Encounter (Signed)
Copied from CRM [id number]. Topic: Clinical - Prescription Issue >> Apr 10, 2023  1:00 PM Brandy W wrote: Reason for CRM: patient calling in regards to sitaGLIPtin (JANUVIA) 100 MG tablet, he was advised to call local pharmacies and find the generic brand, he is calling back to update Revonda Standard that all the pharmacies advised him that the generic form is not out in production yet, she is wanting to speak with her about what his next steps should be. Please call patient back at [phone]

## 2023-04-11 ENCOUNTER — Other Ambulatory Visit (HOSPITAL_BASED_OUTPATIENT_CLINIC_OR_DEPARTMENT_OTHER): Payer: Self-pay

## 2023-04-11 ENCOUNTER — Telehealth: Payer: Self-pay

## 2023-04-11 MED ORDER — SAXAGLIPTIN HCL 5 MG PO TABS
5.0000 mg | ORAL_TABLET | Freq: Every day | ORAL | 0 refills | Status: DC
  Filled 2023-04-11: qty 30, 30d supply, fill #0

## 2023-04-11 NOTE — Addendum Note (Signed)
Addended by: Gwenevere Abbot on: 04/11/2023 04:11 PM   Modules accepted: Orders

## 2023-04-11 NOTE — Telephone Encounter (Signed)
PCP aware , PA denied for Januvia , awaiting PCP to send in alternative  Copied from CRM [id number]. Topic: Clinical - Prescription Issue >> Apr 09, 2023  3:35 PM Chantha C wrote: Reason for CRM: Patient was advised to find generic Januvia, patient has been going to different pharmacies and wants to speak Dahlia Client. Please call back at [phone]

## 2023-04-11 NOTE — Telephone Encounter (Signed)
Spoke w/ Pt- informed of med change. Pt verbalized understanding.

## 2023-04-23 ENCOUNTER — Telehealth: Payer: Self-pay | Admitting: Neurology

## 2023-04-23 NOTE — Telephone Encounter (Signed)
 Copied from CRM [id number]. Topic: Clinical - Medical Advice >> Apr 23, 2023  2:20 PM Shelbie Proctor wrote: Reason for CRM: Patient [phone] states colonoscopy prep medication was not sent to him and he has the procedure this Friday. Informed patient will need to contact the GI office on this matter. Patient wants to speak with Dahlia Client today about the colonoscopy prep, please call back.

## 2023-04-24 ENCOUNTER — Encounter: Payer: Self-pay | Admitting: Pediatrics

## 2023-04-25 ENCOUNTER — Telehealth: Payer: Self-pay

## 2023-04-25 ENCOUNTER — Other Ambulatory Visit: Payer: Self-pay | Admitting: *Deleted

## 2023-04-25 ENCOUNTER — Telehealth: Payer: Self-pay | Admitting: Pediatrics

## 2023-04-25 DIAGNOSIS — Z1211 Encounter for screening for malignant neoplasm of colon: Secondary | ICD-10-CM

## 2023-04-25 MED ORDER — SUFLAVE 178.7 G PO SOLR: 1.0000 | ORAL | 0 refills | Status: DC

## 2023-04-25 NOTE — Telephone Encounter (Signed)
 Received phone call from [facility] that patient had called about his prep being too expensive.   Patient is in the office now picking up a sample of the prep and receiving new instructions from [place], [place]

## 2023-04-25 NOTE — Telephone Encounter (Signed)
 Patient called and stated that he has a procedure tomorrow and does not have his prep medication. Patient is wanting to know where he could pick up his prep medication. Patient is requesting a call back. Please advise.

## 2023-04-25 NOTE — Telephone Encounter (Signed)
 Called patient back and confirmed he never received his Suflav prep in the mail.  I have sent it to his local preferred pharmacy and told him to call us back if he has any issues picking it up.

## 2023-04-25 NOTE — Progress Notes (Unsigned)
 [facility] History and Physical   Primary Care Physician:  [name], PA-C   Reason for Procedure:  Colon cancer screening  Plan:    Greening colonoscopy   HPI: Ethan Holt is a 63 y.o. male undergoing screening colonoscopy for colon cancer screening.  No prior history of colonoscopy.  No family history of colorectal cancer or polyps.  Patient denies rectal bleeding or change in bowel habits.   Past Medical History:  Diagnosis Date   Allergy    spring   Asthma    as child    Diabetes mellitus without complication (HCC)     No past surgical history on file.  Prior to Admission medications   Medication Sig Start Date End Date Taking? Authorizing Provider  acetaminophen (TYLENOL) 500 MG tablet Take 500 mg by mouth every 6 (six) hours as needed. 03/24/13   [provider]  amoxicillin-clavulanate (AUGMENTIN) 875-125 MG tablet Take 1 tablet by mouth 2 (two) times daily. Patient not taking: Reported on 04/01/2023 03/04/23   Saguier, Ramon Dredge, PA-C  atorvastatin (LIPITOR) 10 MG tablet TAKE 1 TABLET BY MOUTH EVERY DAY 08/16/22   Saguier, Ramon Dredge, PA-C  Continuous Blood Gluc Sensor (FREESTYLE LIBRE 2 SENSOR) MISC 1 Device by Does not apply route every 14 (fourteen) days. 04/11/21   Shamleffer, Konrad Dolores, MD  Cyanocobalamin (VITAMIN B-12 PO) Take 2 tablets by mouth daily.     [provider]  empagliflozin (JARDIANCE) 25 MG TABS tablet Take 1 tablet (25 mg total) by mouth daily before breakfast. 03/07/23   Saguier, Ramon Dredge, PA-C  fluticasone [facility]) 50 MCG/ACT nasal spray Place 2 sprays into both nostrils daily. Patient not taking: Reported on 04/01/2023 03/05/22   Saguier, Ramon Dredge, PA-C  ibuprofen (ADVIL) 800 MG tablet Take 1 tablet (800 mg total) by mouth every 8 (eight) hours as needed. 03/05/22   Saguier, Ramon Dredge, PA-C  ibuprofen (ADVIL,MOTRIN) 800 MG tablet Take 1 tablet (800 mg total) by mouth 3 (three) times daily. 11/10/13   Tomasita Crumble, MD  Insulin Pen  Needle 32G X 4 MM MISC 1 Device by Does not apply route daily. 04/11/21   Shamleffer, Konrad Dolores, MD  Multiple Vitamin (MULTIVITAMIN WITH MINERALS) TABS Take 1 tablet by mouth daily.    [provider]  Multiple Vitamins-Minerals (ZINC PO) Take by mouth. Patient not taking: Reported on 04/01/2023    [provider]  OVER THE COUNTER MEDICATION Take 1 capsule by mouth daily. NUGENIX     [provider]  PEG 3350-KCl-NaCl-NaSulf-MgSul (SUFLAVE) 178.7 g SOLR Take 1 kit by mouth as directed. 04/25/23   Ottie Glazier, MD  saxagliptin HCl (ONGLYZA) 5 MG TABS tablet Take 1 tablet (5 mg total) by mouth daily. 04/11/23   Saguier, Ramon Dredge, PA-C  VITAMIN D PO Take by mouth.    [provider]  linagliptin (TRADJENTA) 5 MG TABS tablet Take 1 tablet (5 mg total) by mouth daily. 07/29/19 07/29/19  Saguier, Ramon Dredge, PA-C    Current Outpatient Medications  Medication Sig Dispense Refill   acetaminophen (TYLENOL) 500 MG tablet Take 500 mg by mouth every 6 (six) hours as needed.     amoxicillin-clavulanate (AUGMENTIN) 875-125 MG tablet Take 1 tablet by mouth 2 (two) times daily. (Patient not taking: Reported on 04/01/2023) 20 tablet 0   atorvastatin (LIPITOR) 10 MG tablet TAKE 1 TABLET BY MOUTH EVERY DAY 90 tablet 1   Continuous Blood Gluc Sensor (FREESTYLE LIBRE 2 SENSOR) MISC 1 Device by Does not apply route every 14 (fourteen)  days. 6 each 3   Cyanocobalamin (VITAMIN B-12 PO) Take 2 tablets by mouth daily.      empagliflozin (JARDIANCE) 25 MG TABS tablet Take 1 tablet (25 mg total) by mouth daily before breakfast. 90 tablet 0   fluticasone (FLONASE) 50 MCG/ACT nasal spray Place 2 sprays into both nostrils daily. (Patient not taking: Reported on 04/01/2023) 16 g 1   ibuprofen (ADVIL) 800 MG tablet Take 1 tablet (800 mg total) by mouth every 8 (eight) hours as needed. 30 tablet 0   ibuprofen (ADVIL,MOTRIN) 800 MG tablet Take 1 tablet (800 mg total) by mouth 3 (three) times daily. 21  tablet 0   Insulin Pen Needle 32G X 4 MM MISC 1 Device by Does not apply route daily. 100 each 3   Multiple Vitamin (MULTIVITAMIN WITH MINERALS) TABS Take 1 tablet by mouth daily.     Multiple Vitamins-Minerals (ZINC PO) Take by mouth. (Patient not taking: Reported on 04/01/2023)     OVER THE COUNTER MEDICATION Take 1 capsule by mouth daily. NUGENIX      PEG 3350-KCl-NaCl-NaSulf-MgSul (SUFLAVE) 178.7 g SOLR Take 1 kit by mouth as directed. 1 each 0   saxagliptin HCl (ONGLYZA) 5 MG TABS tablet Take 1 tablet (5 mg total) by mouth daily. 30 tablet 0   VITAMIN D PO Take by mouth.     No current facility-administered medications for this visit.    Allergies as of 04/26/2023   (No Known Allergies)    Family History  Problem Relation Age of Onset   Colon cancer Neg Hx    Stomach cancer Neg Hx    Rectal cancer Neg Hx     Social History   Socioeconomic History   Marital status: Married    Spouse name: Not on file   Number of children: Not on file   Years of education: Not on file   Highest education level: Not on file  Occupational History   Not on file  Tobacco Use   Smoking status: Never   Smokeless tobacco: Never  Substance and Sexual Activity   Alcohol use: No   Drug use: No   Sexual activity: Not on file  Other Topics Concern   Not on file  Social History Narrative   Not on file   Social Drivers of Health   Financial Resource Strain: Not on file  Food Insecurity: Not on file  Transportation Needs: Not on file  Physical Activity: Not on file  Stress: Not on file  Social Connections: Not on file  Intimate Partner Violence: Not on file    Review of Systems:  All other review of systems negative except as mentioned in the HPI.  Physical Exam: Vital signs There were no vitals taken for this visit.  General:   Alert,  Well-developed, well-nourished, pleasant and cooperative in NAD Airway:  Mallampati  Lungs:  Clear throughout to auscultation.   Heart:  Regular  rate and rhythm; no murmurs, clicks, rubs,  or gallops. Abdomen:  Soft, nontender and nondistended. Normal bowel sounds.   Neuro/Psych:  Normal mood and affect. A and O x 3  Maren Beach, MD [facility]

## 2023-04-25 NOTE — Telephone Encounter (Signed)
 Checked this phone note and talked to Dr. Doy Hutching about how she would like to proceed. We discussed that he could get the supplies for the miralax/ ducolax prep but would have do so asap or if he would rather reschedule for a slot next Tuesday and we give him a sample of the Suflave. A discussed with a PV nurse and she is going to call patient and explain his options. MD will be made aware of any changes.

## 2023-04-25 NOTE — Telephone Encounter (Signed)
 Pt calls, states he never received his prep, instruct pt to come to office to receive new instructions for miralax prep and will have to do over the counter prep, pt verb understanding.

## 2023-04-25 NOTE — Telephone Encounter (Signed)
 Patient called and that his insurance did not want to pay for his prep medication and patient is wanting to know what over the counter prep medication he can take. Patient is requesting a call back. Please advise.

## 2023-04-26 ENCOUNTER — Ambulatory Visit (AMBULATORY_SURGERY_CENTER): Payer: BC Managed Care – PPO | Admitting: Pediatrics

## 2023-04-26 ENCOUNTER — Encounter: Payer: Self-pay | Admitting: Pediatrics

## 2023-04-26 VITALS — BP 103/68 | HR 61 | Temp 98.0°F | Resp 15 | Ht 69.0 in | Wt 187.0 lb

## 2023-04-26 DIAGNOSIS — D120 Benign neoplasm of cecum: Secondary | ICD-10-CM

## 2023-04-26 DIAGNOSIS — K648 Other hemorrhoids: Secondary | ICD-10-CM

## 2023-04-26 DIAGNOSIS — Z1211 Encounter for screening for malignant neoplasm of colon: Secondary | ICD-10-CM

## 2023-04-26 MED ORDER — SODIUM CHLORIDE 0.9 % IV SOLN: 500.0000 mL | INTRAVENOUS | Status: DC

## 2023-04-26 NOTE — Patient Instructions (Addendum)
 - Discharge patient to home (ambulatory).                           - 1 polyp removed and sent to pathology. Await results.                           - Internal hemorrhoids. Handouts on findings given to patient.                            - Repeat colonoscopy for surveillance based on                            pathology results.                           - The findings and recommendations were discussed                            with the patient's family.                           - Return to referring physician.  YOU HAD AN ENDOSCOPIC PROCEDURE TODAY AT THE [facility]:   Refer to the procedure report that was given to you for any specific questions about what was found during the examination.  If the procedure report does not answer your questions, please call your gastroenterologist to clarify.  If you requested that your care partner not be given the details of your procedure findings, then the procedure report has been included in a sealed envelope for you to review at your convenience later.  YOU SHOULD EXPECT: Some feelings of bloating in the abdomen. Passage of more gas than usual.  Walking can help get rid of the air that was put into your GI tract during the procedure and reduce the bloating. If you had a lower endoscopy (such as a colonoscopy or flexible sigmoidoscopy) you may notice spotting of blood in your stool or on the toilet paper. If you underwent a bowel prep for your procedure, you may not have a normal bowel movement for a few days.  Please Note:  You might notice some irritation and congestion in your nose or some drainage.  This is from the oxygen used during your procedure.  There is no need for concern and it should clear up in a day or so.  SYMPTOMS TO REPORT IMMEDIATELY:  Following lower endoscopy (colonoscopy or flexible sigmoidoscopy):  Excessive amounts of blood in the stool  Significant tenderness or worsening of abdominal  pains  Swelling of the abdomen that is new, acute  Fever of 100F or higher   For urgent or emergent issues, a gastroenterologist can be reached at any hour by calling [phone]. Do not use MyChart messaging for urgent concerns.    DIET:  We do recommend a small meal at first, but then you may proceed to your regular diet.  Drink plenty of fluids but you should avoid alcoholic beverages for 24 hours.  ACTIVITY:  You should plan to take it easy for the rest of today and you should NOT DRIVE or use heavy machinery until tomorrow (because of the sedation medicines used during the test).  FOLLOW UP: Our staff will call the number listed on your records the next business day following your procedure.  We will call around 7:15- 8:00 am to check on you and address any questions or concerns that you may have regarding the information given to you following your procedure. If we do not reach you, we will leave a message.     If any biopsies were taken you will be contacted by phone or by letter within the next 1-3 weeks.  Please call us at [phone] if you have not heard about the biopsies in 3 weeks.    SIGNATURES/CONFIDENTIALITY: You and/or your care partner have signed paperwork which will be entered into your electronic medical record.  These signatures attest to the fact that that the information above on your After Visit Summary has been reviewed and is understood.  Full responsibility of the confidentiality of this discharge information lies with you and/or your care-partner.

## 2023-04-26 NOTE — Op Note (Addendum)
 [facility] Patient Name: Ethan Holt Procedure Date: 04/26/2023 10:12 AM MRN: [id number] Endoscopist: Maren Beach , MD, [id number] Age: 63 Referring MD:  Date of Birth: 04-Feb-1961 Gender: Male Account #: [id number] Procedure:                Colonoscopy Indications:              High risk colon cancer surveillance: Personal                            history of colonic polyps Medicines:                Monitored Anesthesia Care Procedure:                Pre-Anesthesia Assessment:                           - Prior to the procedure, a History and Physical                            was performed, and patient medications and                            allergies were reviewed. The patient's tolerance of                            previous anesthesia was also reviewed. The risks                            and benefits of the procedure and the sedation                            options and risks were discussed with the patient.                            All questions were answered, and informed consent                            was obtained. Prior Anticoagulants: The patient has                            taken no anticoagulant or antiplatelet agents. ASA                            Grade Assessment: III - A patient with severe                            systemic disease. After reviewing the risks and                            benefits, the patient was deemed in satisfactory                            condition to undergo the procedure.  After obtaining informed consent, the colonoscope                            was passed under direct vision. Throughout the                            procedure, the patient's blood pressure, pulse, and                            oxygen saturations were monitored continuously. The                            CF [id number] #[id number] was introduced through the anus                            and advanced to the cecum,  identified by                            appendiceal orifice and ileocecal valve. The                            colonoscopy was performed without difficulty. The                            patient tolerated the procedure well. The quality                            of the bowel preparation was good. The ileocecal                            valve, appendiceal orifice, and rectum were                            photographed. Scope In: 10:32:47 AM Scope Out: 10:47:54 AM Scope Withdrawal Time: 0 hours 11 minutes 35 seconds  Total Procedure Duration: 0 hours 15 minutes 7 seconds  Findings:                 The perianal and digital rectal examinations were                            normal. Pertinent negatives include normal                            sphincter tone and no palpable rectal lesions.                           A 6 mm polyp was found in the cecum. The polyp was                            sessile. The polyp was removed with a cold snare.                            Resection and retrieval were complete.  Internal hemorrhoids were found during retroflexion. Complications:            No immediate complications. Estimated blood loss:                            Minimal. Estimated Blood Loss:     Estimated blood loss was minimal. Impression:               - One 6 mm polyp in the cecum, removed with a cold                            snare. Resected and retrieved.                           - Internal hemorrhoids. Recommendation:           - Discharge patient to home (ambulatory).                           - Await pathology results.                           - Repeat colonoscopy for surveillance based on                            pathology results.                           - The findings and recommendations were discussed                            with the patient's family.                           - Return to referring physician.                           -  Patient has a contact number available for                            emergencies. The signs and symptoms of potential                            delayed complications were discussed with the                            patient. Return to normal activities tomorrow.                            Written discharge instructions were provided to the                            patient. Maren Beach, MD 04/26/2023 10:51:51 AM This report has been signed electronically. Addendum Number: 1   Addendum Date: 04/26/2023 11:58:59 AM      Colonoscope was changed to scope # [id number] before the procedure Maren Beach, MD 04/26/2023 11:59:24 AM This report has been signed  electronically.

## 2023-04-26 NOTE — Progress Notes (Signed)
 Called to room to assist during endoscopic procedure.  Patient ID and intended procedure confirmed with present staff. Received instructions for my participation in the procedure from the performing physician.

## 2023-04-26 NOTE — Progress Notes (Signed)
 Vss nad trans to pacu

## 2023-04-29 ENCOUNTER — Telehealth: Payer: Self-pay | Admitting: *Deleted

## 2023-04-29 NOTE — Telephone Encounter (Signed)
 Post procedure follow up call placed, no answer and left VM.

## 2023-04-30 ENCOUNTER — Encounter: Payer: Self-pay | Admitting: Pediatrics

## 2023-04-30 LAB — SURGICAL PATHOLOGY

## 2023-05-06 ENCOUNTER — Ambulatory Visit (INDEPENDENT_AMBULATORY_CARE_PROVIDER_SITE_OTHER): Payer: BC Managed Care – PPO | Admitting: Medical

## 2023-05-06 VITALS — BP 130/82 | HR 78 | Temp 98.0°F | Resp 18 | Ht 69.0 in | Wt 189.0 lb

## 2023-05-06 DIAGNOSIS — Z7984 Long term (current) use of oral hypoglycemic drugs: Secondary | ICD-10-CM | POA: Diagnosis not present

## 2023-05-06 DIAGNOSIS — E785 Hyperlipidemia, unspecified: Secondary | ICD-10-CM | POA: Diagnosis not present

## 2023-05-06 DIAGNOSIS — E119 Type 2 diabetes mellitus without complications: Secondary | ICD-10-CM

## 2023-05-06 MED ORDER — ATORVASTATIN CALCIUM 10 MG PO TABS: 10.0000 mg | ORAL_TABLET | Freq: Every day | ORAL | 3 refills | Status: AC

## 2023-05-06 MED ORDER — EMPAGLIFLOZIN 25 MG PO TABS: 25.0000 mg | ORAL_TABLET | Freq: Every day | ORAL | 3 refills | Status: AC

## 2023-05-06 NOTE — Progress Notes (Signed)
 Subjective:    Patient ID: Ethan Holt, male    DOB: [birth year], 63 y.o.   MRN: [id number]  HPI  Discussed the use of AI scribe software for clinical note transcription with the patient, who gave verbal consent to proceed.  History of Present Illness   The patient presents for follow-up of diabetes management.  His last A1c was 10, an increase from a previous value of 7.9, indicating worsening glycemic control. He recently switched from Januvia to Onglyza due to insurance issues and has been on Onglyza 5 mg daily for about 10 days. He also continues to take Jardiance 25 mg daily. Since starting Onglyza, his blood sugar levels have mostly been between 113 and 121, with a peak at 171.  He engages in regular physical activity, including playing in softball tournaments and running bleachers at a high school. He plans to increase his exercise regimen as the weather improves.  He has not had a diabetic eye exam in the past year and does not have an optometrist. The last eye exam was approximately three to four years ago at a location on [address].   For high cholesterol- continue atorvastatin.  He declined the flu vaccine due to past experiences of feeling unwell after receiving it. He is considering the pneumonia vaccine but has not made a decision. He believes he received the shingles vaccine at a pharmacy, but records do not confirm this.           Review of Systems  Constitutional:  Negative for chills, fatigue and fever.  Respiratory:  Negative for cough, chest tightness, shortness of breath and wheezing.   Cardiovascular:  Negative for chest pain and palpitations.  Gastrointestinal:  Negative for abdominal pain, blood in stool, constipation and diarrhea.  Endocrine: Negative for polydipsia, polyphagia and polyuria.  Genitourinary:  Negative for dysuria and frequency.  Neurological:  Negative for seizures, syncope, facial asymmetry, weakness and light-headedness.   Hematological:  Negative for adenopathy. Does not bruise/bleed easily.  Psychiatric/Behavioral:  Negative for agitation and confusion.     Past Medical History:  Diagnosis Date   Allergy    spring   Asthma    as child    Diabetes mellitus without complication (HCC)      Social History   Socioeconomic History   Marital status: Married    Spouse name: Not on file   Number of children: Not on file   Years of education: Not on file   Highest education level: Not on file  Occupational History   Not on file  Tobacco Use   Smoking status: Never   Smokeless tobacco: Never  Substance and Sexual Activity   Alcohol use: No   Drug use: No   Sexual activity: Not on file  Other Topics Concern   Not on file  Social History Narrative   Not on file   Social Drivers of Health   Financial Resource Strain: Not on file  Food Insecurity: Not on file  Transportation Needs: Not on file  Physical Activity: Not on file  Stress: Not on file  Social Connections: Not on file  Intimate Partner Violence: Not on file    No past surgical history on file.  Family History  Problem Relation Age of Onset   Colon cancer Neg Hx    Stomach cancer Neg Hx    Rectal cancer Neg Hx     No Known Allergies  Current Outpatient Medications on File Prior to Visit  Medication Sig  Dispense Refill   acetaminophen (TYLENOL) 500 MG tablet Take 500 mg by mouth every 6 (six) hours as needed.     atorvastatin (LIPITOR) 10 MG tablet TAKE 1 TABLET BY MOUTH EVERY DAY 90 tablet 1   Continuous Blood Gluc Sensor (FREESTYLE LIBRE 2 SENSOR) MISC 1 Device by Does not apply route every 14 (fourteen) days. 6 each 3   Cyanocobalamin (VITAMIN B-12 PO) Take 2 tablets by mouth daily.      fluticasone (FLONASE) 50 MCG/ACT nasal spray Place 2 sprays into both nostrils daily. (Patient not taking: Reported on 04/26/2023) 16 g 1   ibuprofen (ADVIL) 800 MG tablet Take 1 tablet (800 mg total) by mouth every 8 (eight) hours as  needed. 30 tablet 0   ibuprofen (ADVIL,MOTRIN) 800 MG tablet Take 1 tablet (800 mg total) by mouth 3 (three) times daily. (Patient not taking: Reported on 04/26/2023) 21 tablet 0   Insulin Pen Needle 32G X 4 MM MISC 1 Device by Does not apply route daily. 100 each 3   Multiple Vitamin (MULTIVITAMIN WITH MINERALS) TABS Take 1 tablet by mouth daily.     Multiple Vitamins-Minerals (ZINC PO) Take by mouth. (Patient not taking: Reported on 04/26/2023)     OVER THE COUNTER MEDICATION Take 1 capsule by mouth daily. NUGENIX      saxagliptin HCl (ONGLYZA) 5 MG TABS tablet Take 1 tablet (5 mg total) by mouth daily. 30 tablet 0   VITAMIN D PO Take by mouth.     [DISCONTINUED] linagliptin (TRADJENTA) 5 MG TABS tablet Take 1 tablet (5 mg total) by mouth daily. 30 tablet 3   No current facility-administered medications on file prior to visit.    BP 130/82   Pulse 78   Temp 98 F (36.7 C)   Resp 18   Ht 5\' 9"  (1.753 m)   Wt 189 lb (85.7 kg)   SpO2 98%   BMI 27.91 kg/m        Objective:   Physical Exam  General Mental Status- Alert. General Appearance- Not in acute distress.   Skin General: Color- Normal Color. Moisture- Normal Moisture.  Neck Carotid Arteries- Normal color. Moisture- Normal Moisture. No carotid bruits. No JVD.  Chest and Lung Exam Auscultation: Breath Sounds:-Normal.  Cardiovascular Auscultation:Rythm- Regular. Murmurs & Other Heart Sounds:Auscultation of the heart reveals- No Murmurs.  Abdomen Inspection:-Inspeection Normal. Palpation/Percussion:Note:No mass. Palpation and Percussion of the abdomen reveal- Non Tender, Non Distended + BS, no rebound or guarding.   Neurologic Cranial Nerve exam:- CN III-XII intact(No nystagmus), symmetric smile. Strength:- 5/5 equal and symmetric strength both upper and lower extremities.       Assessment & Plan:   Assessment and Plan    Type 2 Diabetes Mellitus HbA1c increased to 10%, indicating suboptimal glycemic  control. Onglyza and Jardiance prescribed. Blood glucose mostly controlled with occasional spikes. - Continue Onglyza 5 mg daily. - Continue Jardiance 25 mg daily. - Increase physical activity, including running and calisthenics. - Order metabolic panel and HbA1c test for the week of April 14th. - Refer for diabetic eye exam.  Hyperlipidemia LDL cholesterol intermittently high. Inconsistent adherence to atorvastatin. - Refill atorvastatin 10 mg, 90 tablets with refills for a year. - Encourage adherence to atorvastatin regimen.  General Health Maintenance Declined flu vaccine. Considering pneumonia vaccine. Unclear shingles vaccine status(offered today but declined). Blood pressure slightly elevated. - Discuss and consider pneumonia and shingles vaccines. - Encourage lifestyle modifications to maintain optimal blood pressure.   Follow up date to  be determined after lab review.        [name], PA-C

## 2023-05-06 NOTE — Patient Instructions (Signed)
 Type 2 Diabetes Mellitus HbA1c increased to 10%, indicating suboptimal glycemic control. Onglyza and Jardiance prescribed. Blood glucose mostly controlled with occasional spikes. - Continue Onglyza 5 mg daily. - Continue Jardiance 25 mg daily. - Increase physical activity, including running and calisthenics. - Order metabolic panel and HbA1c test for the week of April 14th. - Refer for diabetic eye exam.  Hyperlipidemia LDL cholesterol intermittently high. Inconsistent adherence to atorvastatin. - Refill atorvastatin 10 mg, 90 tablets with refills for a year. - Encourage adherence to atorvastatin regimen.  General Health Maintenance Declined flu vaccine. Considering pneumonia vaccine. Unclear shingles vaccine status(offered today but declined). Blood pressure slightly elevated. - Discuss and consider pneumonia and shingles vaccines. - Encourage lifestyle modifications to maintain optimal blood pressure.   Follow up date to be determined after lab review.

## 2023-05-09 ENCOUNTER — Telehealth: Payer: Self-pay

## 2023-05-09 NOTE — Telephone Encounter (Signed)
 Copied from CRM [id number]. Topic: General - Other >> May 09, 2023 11:54 AM Martinique E wrote: Reason for CRM: Patient called regarding the MyChart message he received about the software error with the lab. Patient did not have an urgent need, but more of a general concern if he will have to have labs retaken. Callback number for patient is [phone] to discuss.

## 2023-05-10 ENCOUNTER — Telehealth: Payer: Self-pay | Admitting: *Deleted

## 2023-05-10 NOTE — Telephone Encounter (Signed)
 Patient was identified as falling into the [place] Measure - Diabetes.   Patient was: Appointment scheduled for lab or office visit for A1c.

## 2023-05-13 NOTE — Telephone Encounter (Signed)
   Copied from CRM [id number]. Topic: General - Other >> May 13, 2023  1:38 PM Ethan Holt wrote: Reason for CRM: Pt calling to speak with the doctor or Dahlia Client regarding a temporary DOT "certificate", to prevent the patient from being out of work. Please contact the patient back on this. Needs to get this taken care of before the end of the month.

## 2023-05-14 ENCOUNTER — Telehealth: Payer: Self-pay | Admitting: *Deleted

## 2023-05-14 ENCOUNTER — Telehealth: Payer: Self-pay

## 2023-05-14 NOTE — Telephone Encounter (Signed)
 Pt needs PA for Jardiance , pharmacy stated pt is insurance wants to see pt on Metformin first

## 2023-05-14 NOTE — Telephone Encounter (Signed)
 Pt scheduled for 04/27/23

## 2023-05-14 NOTE — Telephone Encounter (Signed)
 OPENED IN ERROR

## 2023-05-14 NOTE — Telephone Encounter (Signed)
 Disregard , pharmacy was running prescription under wrong insurance , pt is able to pick up medication

## 2023-05-17 ENCOUNTER — Ambulatory Visit (INDEPENDENT_AMBULATORY_CARE_PROVIDER_SITE_OTHER): Admitting: Medical

## 2023-05-17 VITALS — BP 130/84 | HR 78 | Temp 98.0°F | Resp 18 | Ht 69.0 in | Wt 187.0 lb

## 2023-05-17 DIAGNOSIS — E0800 Diabetes mellitus due to underlying condition with hyperosmolarity without nonketotic hyperglycemic-hyperosmolar coma (NKHHC): Secondary | ICD-10-CM

## 2023-05-17 DIAGNOSIS — E119 Type 2 diabetes mellitus without complications: Secondary | ICD-10-CM

## 2023-05-17 DIAGNOSIS — Z7984 Long term (current) use of oral hypoglycemic drugs: Secondary | ICD-10-CM | POA: Diagnosis not present

## 2023-05-17 MED ORDER — SAXAGLIPTIN HCL 5 MG PO TABS: 5.0000 mg | ORAL_TABLET | Freq: Every day | ORAL | 3 refills | Status: DC

## 2023-05-17 NOTE — Patient Instructions (Signed)
 Diabetes Mellitus Type 2 A1c at 10.0, above DOT requirement of <9. Considering short-term disability to manage diabetes and meet DOT requirements. - Continue Onglyza 5 mg and Jardiance 25 mg. - Adhere to low sugar diet. - Check blood glucose twice daily: fasting and postprandial. - Send refills for Onglyza 5 mg, 90 tablets with three refills. - Order A1c and metabolic panel for June 05, 2023. - Write letter for short-term disability stating current A1c and DOT requirement. - Coordinate with DOT examiners to confirm A1c requirements. - Consider referral to endocrinologist Dr. Paulino Door if A1c remains uncontrolled.  Follow up date to be determined after 05-29-23 a1c result.

## 2023-05-17 NOTE — Progress Notes (Signed)
   Subjective:    Patient ID: Ethan Holt, male    DOB: Jul 19, 1960, 64 y.o.   MRN: [id number]  HPI  Pt in for follow up on diabetes/discussed requested letter for DOT.  Last visit  "Type 2 Diabetes Mellitus HbA1c increased to 10%, indicating suboptimal glycemic control. Onglyza and Jardiance prescribed. Blood glucose mostly controlled with occasional spikes. - Continue Onglyza 5 mg daily. - Continue Jardiance 25 mg daily. - Increase physical activity, including running and calisthenics. - Order metabolic panel and HbA1c test for the week of April 14th. - Refer for diabetic eye exam.   Hyperlipidemia LDL cholesterol intermittently high. Inconsistent adherence to atorvastatin. - Refill atorvastatin 10 mg, 90 tablets with refills for a year. - Encourage adherence to atorvastatin regimen."  Discussed the use of AI scribe software for clinical note transcription with the patient, who gave verbal consent to proceed.  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 His A1c was 10.4% in February 2023, which led to a failure in his DOT physical due to the A1c being greater than 9%. He was on short-term disability at that time. He managed to lower his A1c to 7.2% by May 2023, which allowed him to regain his license and work again. However, his A1c has fluctuated since then, with a recent reading of 10.0% in January 2025.  He checks his blood sugar twice daily and is currently on Onglyza 5 mg and Jardiance 25 mg. He follows a low sugar diet. His physical activity has been limited, especially during the winter months, which he believes has contributed to the increase in his A1c levels.   Recent delay in getting rx of onglyza after it was prescribed. He has now started onglyza and is  eating better. Also getting more exercise.        Review of Systems See hpi    Objective:   Physical Exam  General- No acute distress. Pleasant patient. Neck- Full range of motion, no jvd Lungs- Clear, even and unlabored. Heart- regular rate and rhythm. Neurologic- CNII- XII grossly intact.        Assessment & Plan:  Assessment and Plan    Diabetes Mellitus Type 2 A1c at 10.0, above DOT requirement of <9. Considering short-term disability to manage diabetes and meet DOT requirements. - Continue Onglyza 5 mg and Jardiance 25 mg. - Adhere to low sugar diet. - Check blood glucose twice daily: fasting and postprandial. - Send refills for Onglyza 5 mg, 90 tablets with three refills. - Order A1c and metabolic panel for June 05, 2023. - Write letter for short-term disability stating current A1c  and DOT requirement. - Coordinate with DOT examiners to confirm A1c requirements. - Consider referral to endocrinologist Dr. Paulino Door if A1c remains uncontrolled.   Follow up date to be determined after 05-29-23 a1c result.

## 2023-06-03 ENCOUNTER — Telehealth: Payer: Self-pay | Admitting: Medical

## 2023-06-03 NOTE — Telephone Encounter (Signed)
 Copied from CRM [id number]. Topic: Appointments - Appointment Info/Confirmation >> Jun 03, 2023 11:04 AM [name] wrote: Patient/patient representative is calling for information regarding an appointment. Patient would like Ethan Holt to call him back as soon as possible please

## 2023-06-04 MED ORDER — SAXAGLIPTIN HCL 5 MG PO TABS: 5.0000 mg | ORAL_TABLET | Freq: Every day | ORAL | 3 refills | Status: DC

## 2023-06-04 MED ORDER — IBUPROFEN 800 MG PO TABS: 800.0000 mg | ORAL_TABLET | Freq: Three times a day (TID) | ORAL | 0 refills | Status: DC

## 2023-06-04 MED ORDER — IBUPROFEN 800 MG PO TABS: 800.0000 mg | ORAL_TABLET | Freq: Three times a day (TID) | ORAL | 0 refills | Status: AC

## 2023-06-04 NOTE — Addendum Note (Signed)
 Addended by: Maximino Sarin on: 06/04/2023 05:14 PM   Modules accepted: Orders

## 2023-06-04 NOTE — Addendum Note (Signed)
 Addended by: Maximino Sarin on: 06/04/2023 05:15 PM   Modules accepted: Orders

## 2023-06-04 NOTE — Telephone Encounter (Signed)
Medicaiton sent  

## 2023-06-10 ENCOUNTER — Other Ambulatory Visit (INDEPENDENT_AMBULATORY_CARE_PROVIDER_SITE_OTHER)

## 2023-06-10 ENCOUNTER — Telehealth: Payer: Self-pay | Admitting: Medical

## 2023-06-10 DIAGNOSIS — E119 Type 2 diabetes mellitus without complications: Secondary | ICD-10-CM | POA: Diagnosis not present

## 2023-06-10 LAB — COMPREHENSIVE METABOLIC PANEL WITH GFR
ALT: 23 U/L (ref 0–53)
AST: 16 U/L (ref 0–37)
Albumin: 4.3 g/dL (ref 3.5–5.2)
Alkaline Phosphatase: 64 U/L (ref 39–117)
BUN: 18 mg/dL (ref 6–23)
CO2: 28 meq/L (ref 19–32)
Calcium: 9.5 mg/dL (ref 8.4–10.5)
Chloride: 104 meq/L (ref 96–112)
Creatinine, Ser: 1.13 mg/dL (ref 0.40–1.50)
GFR: 68.78 mL/min (ref >60.00)
Glucose, Bld: 123 mg/dL — ABNORMAL HIGH (ref 70–99)
Potassium: 3.9 meq/L (ref 3.5–5.1)
Sodium: 139 meq/L (ref 135–145)
Total Bilirubin: 0.9 mg/dL (ref 0.2–1.2)
Total Protein: 6.8 g/dL (ref 6.0–8.3)

## 2023-06-10 LAB — HEMOGLOBIN A1C: Hgb A1c MFr Bld: 8.8 % — ABNORMAL HIGH (ref 4.6–6.5)

## 2023-06-10 NOTE — Telephone Encounter (Signed)
 Pt was in the office this am for lab only but he wanted me to send a note to Gaylin Ke about him goin on disability- he has type 2 diabetics and he feels like he is ready to do this but he wanted Gaylin Ke thoughts 1st before he starts this with his work. His phone to reach him at is [phone]. Thanks- jsi

## 2023-06-11 ENCOUNTER — Telehealth: Payer: Self-pay | Admitting: Neurology

## 2023-06-11 ENCOUNTER — Encounter: Payer: Self-pay | Admitting: Medical

## 2023-06-11 ENCOUNTER — Ambulatory Visit: Admitting: Medical

## 2023-06-11 NOTE — Telephone Encounter (Signed)
 Copied from CRM [id number]. Topic: General - Other >> Jun 11, 2023  8:32 AM Zipporah Him wrote: Reason for CRM: Patient schedules appointment for [facility] paperwork, requests a call back from [place] who he had been speaking with on mychart.

## 2023-06-11 NOTE — Addendum Note (Signed)
 Addended by: Serafina Damme on: 06/11/2023 06:52 AM   Modules accepted: Orders

## 2023-06-11 NOTE — Telephone Encounter (Signed)
 Pt notified via mychart to scheudle an appt

## 2023-06-12 ENCOUNTER — Ambulatory Visit: Admitting: Medical

## 2023-07-05 ENCOUNTER — Encounter: Payer: Self-pay | Admitting: Medical

## 2023-07-10 DIAGNOSIS — R002 Palpitations: Secondary | ICD-10-CM | POA: Diagnosis not present

## 2023-07-10 DIAGNOSIS — D649 Anemia, unspecified: Secondary | ICD-10-CM | POA: Diagnosis not present

## 2023-07-10 DIAGNOSIS — R1013 Epigastric pain: Secondary | ICD-10-CM | POA: Diagnosis not present

## 2023-07-10 DIAGNOSIS — D509 Iron deficiency anemia, unspecified: Secondary | ICD-10-CM | POA: Diagnosis not present

## 2023-07-11 DIAGNOSIS — D509 Iron deficiency anemia, unspecified: Secondary | ICD-10-CM | POA: Diagnosis not present

## 2023-07-11 DIAGNOSIS — D649 Anemia, unspecified: Secondary | ICD-10-CM | POA: Diagnosis not present

## 2023-07-11 DIAGNOSIS — N920 Excessive and frequent menstruation with regular cycle: Secondary | ICD-10-CM | POA: Diagnosis not present

## 2023-07-11 DIAGNOSIS — R002 Palpitations: Secondary | ICD-10-CM | POA: Diagnosis not present

## 2023-07-13 ENCOUNTER — Encounter: Payer: Self-pay | Admitting: Medical

## 2023-07-16 DIAGNOSIS — D509 Iron deficiency anemia, unspecified: Secondary | ICD-10-CM | POA: Diagnosis not present

## 2023-07-16 DIAGNOSIS — Z13810 Encounter for screening for upper gastrointestinal disorder: Secondary | ICD-10-CM | POA: Diagnosis not present

## 2023-07-16 DIAGNOSIS — R002 Palpitations: Secondary | ICD-10-CM | POA: Diagnosis not present

## 2023-07-16 DIAGNOSIS — K648 Other hemorrhoids: Secondary | ICD-10-CM | POA: Diagnosis not present

## 2023-07-16 DIAGNOSIS — D649 Anemia, unspecified: Secondary | ICD-10-CM | POA: Diagnosis not present

## 2023-07-16 DIAGNOSIS — Z1211 Encounter for screening for malignant neoplasm of colon: Secondary | ICD-10-CM | POA: Diagnosis not present

## 2023-07-16 DIAGNOSIS — R1013 Epigastric pain: Secondary | ICD-10-CM | POA: Diagnosis not present

## 2023-07-24 ENCOUNTER — Ambulatory Visit: Admitting: Medical

## 2023-07-30 ENCOUNTER — Ambulatory Visit: Admitting: Medical

## 2023-08-02 ENCOUNTER — Ambulatory Visit: Admitting: Medical

## 2023-08-07 ENCOUNTER — Ambulatory Visit: Admitting: Medical

## 2023-11-04 ENCOUNTER — Telehealth: Payer: Self-pay

## 2023-11-04 NOTE — Telephone Encounter (Signed)
 Called [employer] to get them to fax another form over  They said they will fax it as soon as it is approved

## 2023-11-06 ENCOUNTER — Telehealth: Payer: Self-pay

## 2023-11-06 NOTE — Telephone Encounter (Signed)
 Called [employer] gave verbal auth for chronic condition form

## 2023-11-06 NOTE — Telephone Encounter (Signed)
 Error

## 2024-01-28 ENCOUNTER — Other Ambulatory Visit: Payer: Self-pay | Admitting: Medical

## 2024-03-02 ENCOUNTER — Other Ambulatory Visit: Payer: Self-pay | Admitting: Family

## 2024-03-02 ENCOUNTER — Other Ambulatory Visit (HOSPITAL_COMMUNITY): Payer: Self-pay

## 2024-03-04 ENCOUNTER — Other Ambulatory Visit (HOSPITAL_COMMUNITY): Payer: Self-pay

## 2024-03-04 ENCOUNTER — Telehealth: Payer: Self-pay

## 2024-03-04 MED ORDER — LINAGLIPTIN 5 MG PO TABS: 5.0000 mg | ORAL_TABLET | Freq: Every day | ORAL | 11 refills | Status: DC

## 2024-03-04 NOTE — Telephone Encounter (Signed)
 Pharmacy Patient Advocate Encounter   Received notification from RX Request Messages that prior authorization for Tradjenta  5mg  tabs is required/requested.   Insurance verification completed.   The patient is insured through [employer].   Per test claim: The current 30 day co-pay is, $392.81.  No PA needed at this time. This test claim was processed through [facility]- copay amounts may vary at other pharmacies due to pharmacy/plan contracts, or as the patient moves through the different stages of their insurance plan.     The copay includes $355 that is applied to the deductible.

## 2024-03-04 NOTE — Telephone Encounter (Signed)
 I just sent in tradjenta  for patient in place of his onglyza  which I was informed not covered. Have patient schedule for follow up as he is overdue for diabetic check up. Also have referred to endocrinologist in the past since his diabetes is not controlled and his empoymet relies on good control. So please get him scheduled for follow up. Looks like that office called him various times to get him scheduled but referral did go thru.

## 2024-03-04 NOTE — Addendum Note (Signed)
 Addended by: DORINA DALLAS DORINA PA-C M on: 03/04/2024 07:07 AM   Modules accepted: Orders

## 2024-03-05 NOTE — Telephone Encounter (Signed)
 Called pt and notified him. He said he didn't know anything about needing a new medication. Advised him that it would be best to schedule and apt and fu seeing that he has not been seen for his diabetes since march

## 2024-03-09 ENCOUNTER — Ambulatory Visit: Payer: Self-pay | Admitting: Medical

## 2024-03-13 ENCOUNTER — Ambulatory Visit: Payer: Self-pay | Admitting: Medical

## 2024-03-13 ENCOUNTER — Other Ambulatory Visit (HOSPITAL_BASED_OUTPATIENT_CLINIC_OR_DEPARTMENT_OTHER): Payer: Self-pay

## 2024-03-13 VITALS — BP 118/84 | HR 74 | Temp 97.7°F | Resp 15 | Ht 69.0 in | Wt 185.8 lb

## 2024-03-13 DIAGNOSIS — E1165 Type 2 diabetes mellitus with hyperglycemia: Secondary | ICD-10-CM

## 2024-03-13 DIAGNOSIS — Z7984 Long term (current) use of oral hypoglycemic drugs: Secondary | ICD-10-CM

## 2024-03-13 DIAGNOSIS — Z1159 Encounter for screening for other viral diseases: Secondary | ICD-10-CM

## 2024-03-13 DIAGNOSIS — E119 Type 2 diabetes mellitus without complications: Secondary | ICD-10-CM

## 2024-03-13 DIAGNOSIS — E785 Hyperlipidemia, unspecified: Secondary | ICD-10-CM | POA: Diagnosis not present

## 2024-03-13 MED ORDER — LINAGLIPTIN 5 MG PO TABS
5.0000 mg | ORAL_TABLET | Freq: Every day | ORAL | 0 refills | Status: DC
  Filled 2024-03-13 – 2024-03-16 (×2): qty 30, 30d supply, fill #0

## 2024-03-13 NOTE — Patient Instructions (Signed)
 Uncontrolled type 2 diabetes mellitus with hyperglycemia A1c at 10.0% indicates poor control. Issues with saxagliptin  due to insurance. Tradjenta  suggested preferred but also faces coverage issue?SABRA No endocrinologist visits due to scheduling/referral failed. - Ordered A1c and metabolic panel. - Ordered urine microalbumin test. - Sent Tradjenta  prescription to pharmacy at [facility]. - Referred to endocrinologist. - Advised continuation of Jardiance  and low sugar diet. - If no endocrinologist appointment in three months, schedule follow-up in three months.  Hyperlipidemia Managed with atorvastatin . Blood pressure controlled at 118/84 mmHg. - Ordered cholesterol panel.  Diabetic eye exam No exam in past year. Previous referral incomplete. - Referred to [facility] for diabetic eye exam. - Provided contact number for [facility].  Vaccines declined  Follow up in 3 months or sooner if needed

## 2024-03-13 NOTE — Progress Notes (Signed)
 "  Subjective:    Patient ID: Ethan Holt, male    DOB: 11-Oct-1960, 64 y.o.   MRN: [id number]  HPI  Ethan Holt is a 64 year old male with diabetes who presents for medication management and follow-up of uncontrolled blood sugar levels.  He has poorly controlled diabetes with A1c 10.0% in January 2025. Since his March 2025 visit he has had difficulty controlling blood sugar, which has limited his ability to work and drive as his former drive needed DOT license. An endocrinology referral was placed in April 2025 but he did not schedule due to MyChart access problems.  He takes Jardiance . He previously used saxagliptin  but ran out after a 30-day supply. His insurance prefers Tradjenta  per note/rewust from pharmacist  however  he has been unable to obtain due to coverage issues and is awaiting pharmacy guidance?  He has not had a dedicated diabetic eye exam in the last year. He only had an eye exam as part of a DOT test in early 2025. A diabetic eye exam referral from March 2025 was not completed.  He takes atorvastatin  for high cholesterol and reports good blood pressure control. He has not received shingles, flu, or pneumonia vaccines. He is currently not working or driving but is considering returning to work, which depends on improved diabetes control.   Review of Systems  Constitutional:  Negative for chills and fatigue.  Respiratory:  Negative for chest tightness, shortness of breath and wheezing.   Cardiovascular:  Negative for chest pain and palpitations.  Gastrointestinal:  Negative for abdominal pain.  Endocrine: Negative for polydipsia, polyphagia and polyuria.  Genitourinary:  Negative for frequency.  Musculoskeletal:  Negative for back pain.  Neurological:  Negative for dizziness and headaches.  Hematological:  Negative for adenopathy.  Psychiatric/Behavioral:  Negative for behavioral problems and decreased concentration.     Past Medical History:  Diagnosis Date   Allergy     spring   Asthma    as child    Diabetes mellitus without complication (HCC)      Social History   Socioeconomic History   Marital status: Married    Spouse name: Not on file   Number of children: Not on file   Years of education: Not on file   Highest education level: Not on file  Occupational History   Not on file  Tobacco Use   Smoking status: Never   Smokeless tobacco: Never  Substance and Sexual Activity   Alcohol use: No   Drug use: No   Sexual activity: Not on file  Other Topics Concern   Not on file  Social History Narrative   Not on file   Social Drivers of Health   Tobacco Use: Low Risk (04/26/2023)   Patient History    Smoking Tobacco Use: Never    Smokeless Tobacco Use: Never    Passive Exposure: Not on file  Financial Resource Strain: Not on file  Food Insecurity: Not on file  Transportation Needs: Not on file  Physical Activity: Not on file  Stress: Not on file  Social Connections: Not on file  Intimate Partner Violence: Not on file  Depression (PHQ2-9): Low Risk (03/13/2024)   Depression (PHQ2-9)    PHQ-2 Score: 0  Alcohol Screen: Not on file  Housing: Not on file  Utilities: Not on file  Health Literacy: Not on file    No past surgical history on file.  Family History  Problem Relation Age of Onset   Colon cancer Neg  Hx    Stomach cancer Neg Hx    Rectal cancer Neg Hx     Allergies[1]  Medications Ordered Prior to Encounter[2]  BP 118/84   Pulse 74   Temp 97.7 F (36.5 C) (Oral)   Resp 15   Ht 5' 9 (1.753 m)   Wt 185 lb 12.8 oz (84.3 kg)   SpO2 99%   BMI 27.44 kg/m        Objective:   Physical Exam  General- No acute distress. Pleasant patient. Neck- Full range of motion, no jvd Lungs- Clear, even and unlabored. Heart- regular rate and rhythm. Neurologic- CNII- XII grossly intact.  Lower ext- see quality metrics.      Assessment & Plan:  Uncontrolled type 2 diabetes mellitus with hyperglycemia A1c at 10.0%  indicates poor control. Issues with saxagliptin  due to insurance. Tradjenta  suggested preferred but also faces coverage issue?Ethan Holt No endocrinologist visits due to scheduling/referral failed. - Ordered A1c and metabolic panel. - Ordered urine microalbumin test. - Sent Tradjenta  prescription to pharmacy at [facility]. - Referred to endocrinologist. - Advised continuation of Jardiance  and low sugar diet. - If no endocrinologist appointment in three months, schedule follow-up in three months.  Hyperlipidemia Managed with atorvastatin . Blood pressure controlled at 118/84 mmHg. - Ordered cholesterol panel.  Diabetic eye exam No exam in past year. Previous referral incomplete. - Referred to [facility] for diabetic eye exam. - Provided contact number for [facility].  Vaccines declined  Follow up in 3 months or sooner if needed  Trimaine Maser, PA-C      [1] No Known Allergies [2]  Current Outpatient Medications on File Prior to Visit  Medication Sig Dispense Refill   acetaminophen  (TYLENOL ) 500 MG tablet Take 500 mg by mouth every 6 (six) hours as needed.     atorvastatin  (LIPITOR) 10 MG tablet TAKE 1 TABLET BY MOUTH EVERY DAY 90 tablet 1   atorvastatin  (LIPITOR) 10 MG tablet Take 1 tablet (10 mg total) by mouth daily. 90 tablet 3   Continuous Blood Gluc Sensor (FREESTYLE LIBRE 2 SENSOR) MISC 1 Device by Does not apply route every 14 (fourteen) days. 6 each 3   Cyanocobalamin  (VITAMIN B-12 PO) Take 2 tablets by mouth daily.      empagliflozin  (JARDIANCE ) 25 MG TABS tablet Take 1 tablet (25 mg total) by mouth daily before breakfast. 90 tablet 3   ibuprofen  (ADVIL ) 800 MG tablet Take 1 tablet (800 mg total) by mouth every 8 (eight) hours as needed. 30 tablet 0   ibuprofen  (ADVIL ) 800 MG tablet Take 1 tablet (800 mg total) by mouth 3 (three) times daily. 21 tablet 0   Insulin  Pen Needle 32G X 4 MM MISC 1 Device by Does not apply route daily. 100 each 3    linagliptin  (TRADJENTA ) 5 MG TABS tablet Take 1 tablet (5 mg total) by mouth daily. 30 tablet 11   Multiple Vitamin (MULTIVITAMIN WITH MINERALS) TABS Take 1 tablet by mouth daily.     OVER THE COUNTER MEDICATION Take 1 capsule by mouth daily. NUGENIX      VITAMIN D  PO Take by mouth.     fluticasone  (FLONASE ) 50 MCG/ACT nasal spray Place 2 sprays into both nostrils daily. (Patient not taking: Reported on 03/13/2024) 16 g 1   Multiple Vitamins-Minerals (ZINC PO) Take by mouth. (Patient not taking: Reported on 03/13/2024)     No current facility-administered medications on file prior to visit.   "

## 2024-03-14 LAB — MICROALBUMIN / CREATININE URINE RATIO
Creatinine, Urine: 127 mg/dL (ref 20–320)
Microalb Creat Ratio: 6 mg/g{creat}
Microalb, Ur: 0.7 mg/dL

## 2024-03-15 ENCOUNTER — Ambulatory Visit: Payer: Self-pay | Admitting: Medical

## 2024-03-15 LAB — HEMOGLOBIN A1C
Hgb A1c MFr Bld: 10.7 % — ABNORMAL HIGH
Mean Plasma Glucose: 260 mg/dL
eAG (mmol/L): 14.4 mmol/L

## 2024-03-15 LAB — COMPREHENSIVE METABOLIC PANEL WITH GFR
AG Ratio: 1.6 (calc) (ref 1.0–2.5)
ALT: 39 U/L (ref 9–46)
AST: 18 U/L (ref 10–35)
Albumin: 4.4 g/dL (ref 3.6–5.1)
Alkaline phosphatase (APISO): 86 U/L (ref 35–144)
BUN: 17 mg/dL (ref 7–25)
CO2: 27 mmol/L (ref 20–32)
Calcium: 9.7 mg/dL (ref 8.6–10.3)
Chloride: 104 mmol/L (ref 98–110)
Creat: 1.05 mg/dL (ref 0.70–1.35)
Globulin: 2.8 g/dL (ref 1.9–3.7)
Glucose, Bld: 118 mg/dL — ABNORMAL HIGH (ref 65–99)
Potassium: 4.1 mmol/L (ref 3.5–5.3)
Sodium: 139 mmol/L (ref 135–146)
Total Bilirubin: 0.7 mg/dL (ref 0.2–1.2)
Total Protein: 7.2 g/dL (ref 6.1–8.1)
eGFR: 79 mL/min/1.73m2

## 2024-03-15 LAB — LIPID PANEL
Cholesterol: 174 mg/dL
HDL: 50 mg/dL
LDL Cholesterol (Calc): 110 mg/dL — ABNORMAL HIGH
Non-HDL Cholesterol (Calc): 124 mg/dL
Total CHOL/HDL Ratio: 3.5 (calc)
Triglycerides: 53 mg/dL

## 2024-03-15 LAB — HEPATITIS C ANTIBODY: Hepatitis C Ab: NONREACTIVE

## 2024-03-16 ENCOUNTER — Other Ambulatory Visit (HOSPITAL_COMMUNITY): Payer: Self-pay

## 2024-03-16 ENCOUNTER — Other Ambulatory Visit (HOSPITAL_BASED_OUTPATIENT_CLINIC_OR_DEPARTMENT_OTHER): Payer: Self-pay

## 2024-03-16 MED ORDER — SITAGLIPTIN PHOSPHATE 50 MG PO TABS
50.0000 mg | ORAL_TABLET | ORAL | 0 refills | Status: AC
  Filled 2024-03-16: qty 30, 30d supply, fill #0

## 2024-03-16 NOTE — Addendum Note (Signed)
 Addended by: DORINA DALLAS DORINA PA-C M on: 03/16/2024 08:40 PM   Modules accepted: Orders

## 2024-03-17 ENCOUNTER — Other Ambulatory Visit (HOSPITAL_BASED_OUTPATIENT_CLINIC_OR_DEPARTMENT_OTHER): Payer: Self-pay

## 2024-03-31 ENCOUNTER — Other Ambulatory Visit (HOSPITAL_BASED_OUTPATIENT_CLINIC_OR_DEPARTMENT_OTHER): Payer: Self-pay
